# Patient Record
Sex: Male | Born: 1950 | Race: White | Hispanic: No | Smoking: Current every day smoker
Health system: Southern US, Community
[De-identification: ages and names within clinical notes are randomized; demographics above are authoritative.]

## PROBLEM LIST (undated history)

## (undated) DIAGNOSIS — I480 Paroxysmal atrial fibrillation: Secondary | ICD-10-CM

## (undated) DIAGNOSIS — M1990 Unspecified osteoarthritis, unspecified site: Secondary | ICD-10-CM

## (undated) DIAGNOSIS — J189 Pneumonia, unspecified organism: Secondary | ICD-10-CM

## (undated) DIAGNOSIS — K219 Gastro-esophageal reflux disease without esophagitis: Secondary | ICD-10-CM

## (undated) DIAGNOSIS — I639 Cerebral infarction, unspecified: Secondary | ICD-10-CM

## (undated) HISTORY — DX: Paroxysmal atrial fibrillation: I48.0

## (undated) HISTORY — PX: NASAL FRACTURE SURGERY: SHX718

## (undated) HISTORY — DX: Gastro-esophageal reflux disease without esophagitis: K21.9

## (undated) HISTORY — PX: FRACTURE SURGERY: SHX138

---

## 2007-06-04 DIAGNOSIS — F17210 Nicotine dependence, cigarettes, uncomplicated: Secondary | ICD-10-CM | POA: Diagnosis present

## 2014-04-28 DIAGNOSIS — J189 Pneumonia, unspecified organism: Secondary | ICD-10-CM

## 2014-04-28 HISTORY — DX: Pneumonia, unspecified organism: J18.9

## 2014-08-06 DIAGNOSIS — J189 Pneumonia, unspecified organism: Secondary | ICD-10-CM | POA: Diagnosis not present

## 2014-08-21 DIAGNOSIS — R062 Wheezing: Secondary | ICD-10-CM | POA: Insufficient documentation

## 2014-08-22 DIAGNOSIS — J309 Allergic rhinitis, unspecified: Secondary | ICD-10-CM | POA: Diagnosis not present

## 2014-08-22 DIAGNOSIS — J189 Pneumonia, unspecified organism: Secondary | ICD-10-CM | POA: Diagnosis not present

## 2014-08-22 DIAGNOSIS — R062 Wheezing: Secondary | ICD-10-CM | POA: Diagnosis not present

## 2014-08-22 DIAGNOSIS — I480 Paroxysmal atrial fibrillation: Secondary | ICD-10-CM | POA: Diagnosis not present

## 2014-08-31 DIAGNOSIS — E780 Pure hypercholesterolemia: Secondary | ICD-10-CM | POA: Diagnosis not present

## 2014-08-31 DIAGNOSIS — J189 Pneumonia, unspecified organism: Secondary | ICD-10-CM | POA: Diagnosis not present

## 2014-08-31 DIAGNOSIS — K219 Gastro-esophageal reflux disease without esophagitis: Secondary | ICD-10-CM | POA: Diagnosis not present

## 2014-08-31 DIAGNOSIS — R5383 Other fatigue: Secondary | ICD-10-CM | POA: Diagnosis not present

## 2014-09-26 DIAGNOSIS — J189 Pneumonia, unspecified organism: Secondary | ICD-10-CM | POA: Insufficient documentation

## 2014-09-29 DIAGNOSIS — J189 Pneumonia, unspecified organism: Secondary | ICD-10-CM | POA: Diagnosis not present

## 2014-09-29 DIAGNOSIS — R5383 Other fatigue: Secondary | ICD-10-CM | POA: Diagnosis not present

## 2015-01-08 DIAGNOSIS — M542 Cervicalgia: Secondary | ICD-10-CM | POA: Diagnosis not present

## 2015-01-08 DIAGNOSIS — I480 Paroxysmal atrial fibrillation: Secondary | ICD-10-CM | POA: Diagnosis not present

## 2015-01-08 DIAGNOSIS — K219 Gastro-esophageal reflux disease without esophagitis: Secondary | ICD-10-CM | POA: Diagnosis not present

## 2015-06-29 DIAGNOSIS — M542 Cervicalgia: Secondary | ICD-10-CM | POA: Diagnosis not present

## 2015-06-29 DIAGNOSIS — R5383 Other fatigue: Secondary | ICD-10-CM | POA: Diagnosis not present

## 2015-06-29 DIAGNOSIS — I480 Paroxysmal atrial fibrillation: Secondary | ICD-10-CM | POA: Diagnosis not present

## 2015-06-29 DIAGNOSIS — K219 Gastro-esophageal reflux disease without esophagitis: Secondary | ICD-10-CM | POA: Diagnosis not present

## 2015-07-03 DIAGNOSIS — M542 Cervicalgia: Secondary | ICD-10-CM | POA: Diagnosis not present

## 2015-07-03 DIAGNOSIS — Z720 Tobacco use: Secondary | ICD-10-CM | POA: Diagnosis not present

## 2015-07-03 DIAGNOSIS — I480 Paroxysmal atrial fibrillation: Secondary | ICD-10-CM | POA: Diagnosis not present

## 2015-07-03 DIAGNOSIS — K219 Gastro-esophageal reflux disease without esophagitis: Secondary | ICD-10-CM | POA: Diagnosis not present

## 2015-12-27 DIAGNOSIS — R5383 Other fatigue: Secondary | ICD-10-CM | POA: Insufficient documentation

## 2015-12-31 DIAGNOSIS — M25519 Pain in unspecified shoulder: Secondary | ICD-10-CM | POA: Insufficient documentation

## 2015-12-31 DIAGNOSIS — K219 Gastro-esophageal reflux disease without esophagitis: Secondary | ICD-10-CM | POA: Insufficient documentation

## 2016-01-01 DIAGNOSIS — I480 Paroxysmal atrial fibrillation: Secondary | ICD-10-CM | POA: Diagnosis not present

## 2016-01-01 DIAGNOSIS — K219 Gastro-esophageal reflux disease without esophagitis: Secondary | ICD-10-CM | POA: Diagnosis not present

## 2016-01-01 DIAGNOSIS — M25512 Pain in left shoulder: Secondary | ICD-10-CM | POA: Diagnosis not present

## 2016-01-01 DIAGNOSIS — Z720 Tobacco use: Secondary | ICD-10-CM | POA: Diagnosis not present

## 2016-01-01 DIAGNOSIS — Z6826 Body mass index (BMI) 26.0-26.9, adult: Secondary | ICD-10-CM | POA: Diagnosis not present

## 2016-07-31 DIAGNOSIS — Z720 Tobacco use: Secondary | ICD-10-CM | POA: Diagnosis not present

## 2016-07-31 DIAGNOSIS — I480 Paroxysmal atrial fibrillation: Secondary | ICD-10-CM | POA: Diagnosis not present

## 2016-07-31 DIAGNOSIS — K219 Gastro-esophageal reflux disease without esophagitis: Secondary | ICD-10-CM | POA: Diagnosis not present

## 2016-07-31 DIAGNOSIS — Z6825 Body mass index (BMI) 25.0-25.9, adult: Secondary | ICD-10-CM | POA: Diagnosis not present

## 2016-08-26 ENCOUNTER — Encounter: Payer: Self-pay | Admitting: Cardiology

## 2016-08-26 NOTE — Progress Notes (Signed)
Cardiology Office Note  Date: 08/27/2016   ID: [name], DOB [birth year], MRN [id number]  PCP: Macky Lower  Consulting Cardiologist: [name], MD   Chief Complaint  Patient presents with  . PAF    History of Present Illness: Edward Young is a 66 y.o. male referred for cardiology consultation by Ms. Skillman PA-C for the assessment of atrial fibrillation. I was not able to access any of his prior cardiology records. He has a reported history of paroxysmal atrial fibrillation. Patient was previously followed in the [facility], not seen in several years. CHADSVASC score is 1. Based on discussion with him today, it sounds like he was on sotalol for a period of time for suppression of atrial fibrillation, although did not tolerate this medication in terms of rhythm control and also what sounds like symptomatic bradycardia. He states that breakthrough events occurred about once a year but increased in frequency, and he has had much more frequent events since January. He describes a feeling of rapid heart rate proceeded by a skip, sometimes discomfort in his chest when this happens. He is not reporting any history of ischemic heart disease or cardiomyopathy, reports having a stress test perhaps for 5 years ago.  I personally reviewed the recent ECG from 07/30/2016 which showed normal sinus rhythm.  At the present time he is not taking any medications when he has palpitations other than using an aspirin.  Past Medical History:  Diagnosis Date  . GERD (gastroesophageal reflux disease)   . PAF (paroxysmal atrial fibrillation) (HCC)     Past Surgical History:  Procedure Laterality Date  . NOSE SURGERY      Current Outpatient Prescriptions  Medication Sig Dispense Refill  . aspirin 325 MG tablet Take 325 mg by mouth daily as needed.      No current facility-administered medications for this visit.    Allergies:  Patient has no known  allergies.   Social History: The patient  reports that he has been smoking Cigarettes.  He started smoking about 47 years ago. He has been smoking about 1.00 pack per day. He has never used smokeless tobacco. He reports that he does not drink alcohol or use drugs.   Family History: The patient's family history includes Alzheimer's disease in his mother; Hypertension in his father; Kidney cancer in his maternal grandfather; Parkinson's disease in his brother; Pneumonia in his paternal grandmother.   ROS:  Please see the history of present illness. Otherwise, complete review of systems is positive for none.  All other systems are reviewed and negative.   Physical Exam: VS:  BP 114/81 (BP Location: Right Arm, Cuff Size: Normal)   Pulse 69   Ht  (1.702 m)   Wt 160 lb (72.6 kg)   SpO2 97%   BMI 25.06 kg/m , BMI Body mass index is 25.06 kg/m.  Wt Readings from Last 3 Encounters:  08/27/16 160 lb (72.6 kg)  07/31/16 165 lb (74.8 kg)    General: Patient appears comfortable at rest. HEENT: Conjunctiva and lids normal, oropharynx clear with moist mucosa. Neck: Supple, no elevated JVP or carotid bruits, no thyromegaly. Lungs: Clear to auscultation, nonlabored breathing at rest. Cardiac: Regular rate and rhythm, no S3 or significant systolic murmur, no pericardial rub. Abdomen: Soft, nontender, bowel sounds present, no guarding or rebound. Extremities: No pitting edema, distal pulses 2+. Skin: Warm and dry. Musculoskeletal: No kyphosis. Neuropsychiatric: Alert and oriented x3, affect grossly appropriate.  ECG: No old  tracing available for comparison.  Assessment and Plan:  1. Reported history of paroxysmal atrial fibrillation, patient states for at least the last 15 years. I do not have any old records for review. He does report increasing frequency of palpitations and has a relatively low thromboembolic risk score. Plan at this point is to obtain a seven-day cardiac monitor to make  sure that his palpitations correspond with atrial fibrillation, also an echocardiogram to assess cardiac structure and function. Will bring him back to the office to discuss the next step.  2. Ongoing tobacco abuse. Smoking cessation discussed.  3. History of GERD, no specific medications on a standing basis at this time.  Current medicines were reviewed with the patient today.   Orders Placed This Encounter  Procedures  . Cardiac event monitor  . ECHOCARDIOGRAM COMPLETE    Disposition: Follow-up to review test results.  Signed, Jonelle Sidle, MD, [facility] 08/27/2016 10:47 AM    [facility] [address] [place], [place], [place] [address] Phone: [phone]; Fax: [phone]

## 2016-08-27 ENCOUNTER — Ambulatory Visit (INDEPENDENT_AMBULATORY_CARE_PROVIDER_SITE_OTHER): Payer: Medicare Other | Admitting: Cardiology

## 2016-08-27 ENCOUNTER — Encounter: Payer: Self-pay | Admitting: Cardiology

## 2016-08-27 VITALS — BP 114/81 | HR 69 | Ht 67.0 in | Wt 160.0 lb

## 2016-08-27 DIAGNOSIS — Z720 Tobacco use: Secondary | ICD-10-CM

## 2016-08-27 DIAGNOSIS — I480 Paroxysmal atrial fibrillation: Secondary | ICD-10-CM | POA: Diagnosis not present

## 2016-08-27 DIAGNOSIS — K219 Gastro-esophageal reflux disease without esophagitis: Secondary | ICD-10-CM | POA: Diagnosis not present

## 2016-08-27 NOTE — Patient Instructions (Signed)
Medication Instructions:  Your physician recommends that you continue on your current medications as directed. Please refer to the Current Medication list given to you today.  Labwork: NONE  Testing/Procedures: Your physician has requested that you have an echocardiogram. Echocardiography is a painless test that uses sound waves to create images of your heart. It provides your doctor with information about the size and shape of your heart and how well your heart's chambers and valves are working. This procedure takes approximately one hour. There are no restrictions for this procedure.  Your physician has recommended that you wear an event monitor FOR 7 DAYS. Event monitors are medical devices that record the heart's electrical activity. Doctors most often [place] these monitors to diagnose arrhythmias. Arrhythmias are problems with the speed or rhythm of the heartbeat. The monitor is a small, portable device. You can wear one while you do your normal daily activities. This is usually used to diagnose what is causing palpitations/syncope (passing out).   Follow-Up: Your physician recommends that you schedule a follow-up appointment in: 3-4 WEEKS WITH DR. MCDOWELL  Any Other Special Instructions Will Be Listed Below (If Applicable).  If you need a refill on your cardiac medications before your next appointment, please call your pharmacy.

## 2016-09-06 ENCOUNTER — Encounter: Payer: Self-pay | Admitting: Cardiology

## 2016-09-06 DIAGNOSIS — I480 Paroxysmal atrial fibrillation: Secondary | ICD-10-CM | POA: Diagnosis not present

## 2016-09-07 ENCOUNTER — Observation Stay (HOSPITAL_COMMUNITY)
Admission: EM | Admit: 2016-09-07 | Discharge: 2016-09-08 | Disposition: A | Discharge status: Home / Self Care | Payer: Medicare Other | Attending: Family Medicine | Admitting: Family Medicine

## 2016-09-07 ENCOUNTER — Telehealth: Payer: Self-pay | Admitting: Adult Health

## 2016-09-07 ENCOUNTER — Emergency Department (HOSPITAL_COMMUNITY): Payer: Medicare Other

## 2016-09-07 ENCOUNTER — Encounter (HOSPITAL_COMMUNITY): Payer: Self-pay

## 2016-09-07 DIAGNOSIS — I480 Paroxysmal atrial fibrillation: Principal | ICD-10-CM | POA: Insufficient documentation

## 2016-09-07 DIAGNOSIS — F17210 Nicotine dependence, cigarettes, uncomplicated: Secondary | ICD-10-CM | POA: Insufficient documentation

## 2016-09-07 DIAGNOSIS — Z9889 Other specified postprocedural states: Secondary | ICD-10-CM | POA: Insufficient documentation

## 2016-09-07 DIAGNOSIS — R002 Palpitations: Secondary | ICD-10-CM | POA: Diagnosis not present

## 2016-09-07 DIAGNOSIS — Z8249 Family history of ischemic heart disease and other diseases of the circulatory system: Secondary | ICD-10-CM | POA: Diagnosis not present

## 2016-09-07 DIAGNOSIS — Z7982 Long term (current) use of aspirin: Secondary | ICD-10-CM | POA: Insufficient documentation

## 2016-09-07 DIAGNOSIS — Z8051 Family history of malignant neoplasm of kidney: Secondary | ICD-10-CM | POA: Insufficient documentation

## 2016-09-07 DIAGNOSIS — I700 Atherosclerosis of aorta: Secondary | ICD-10-CM | POA: Insufficient documentation

## 2016-09-07 DIAGNOSIS — Z820 Family history of epilepsy and other diseases of the nervous system: Secondary | ICD-10-CM | POA: Diagnosis not present

## 2016-09-07 DIAGNOSIS — K219 Gastro-esophageal reflux disease without esophagitis: Secondary | ICD-10-CM | POA: Diagnosis not present

## 2016-09-07 DIAGNOSIS — I4891 Unspecified atrial fibrillation: Secondary | ICD-10-CM

## 2016-09-07 DIAGNOSIS — F17200 Nicotine dependence, unspecified, uncomplicated: Secondary | ICD-10-CM

## 2016-09-07 LAB — CBC WITH DIFFERENTIAL/PLATELET
BASOS PCT: 1 %
Basophils Absolute: 0 10*3/uL (ref 0.0–0.1)
EOS ABS: 0.1 10*3/uL (ref 0.0–0.7)
EOS PCT: 1 %
HCT: 52.6 % — ABNORMAL HIGH (ref 39.0–52.0)
Hemoglobin: 18.7 g/dL — ABNORMAL HIGH (ref 13.0–17.0)
Lymphocytes Relative: 38 %
Lymphs Abs: 2.9 10*3/uL (ref 0.7–4.0)
MCH: 34.5 pg — AB (ref 26.0–34.0)
MCHC: 35.6 g/dL (ref 30.0–36.0)
MCV: 97 fL (ref 78.0–100.0)
Monocytes Absolute: 0.9 10*3/uL (ref 0.1–1.0)
Monocytes Relative: 12 %
Neutro Abs: 3.7 10*3/uL (ref 1.7–7.7)
Neutrophils Relative %: 48 %
PLATELETS: 247 10*3/uL (ref 150–400)
RBC: 5.42 MIL/uL (ref 4.22–5.81)
RDW: 14.3 % (ref 11.5–15.5)
WBC: 7.6 10*3/uL (ref 4.0–10.5)

## 2016-09-07 LAB — BASIC METABOLIC PANEL
ANION GAP: 9 (ref 5–15)
BUN: 5 mg/dL — AB (ref 6–20)
CALCIUM: 9.3 mg/dL (ref 8.9–10.3)
CO2: 22 mmol/L (ref 22–32)
CREATININE: 0.93 mg/dL (ref 0.61–1.24)
Chloride: 106 mmol/L (ref 101–111)
GFR calc Af Amer: >60 mL/min (ref >60)
Glucose, Bld: 142 mg/dL — ABNORMAL HIGH (ref 65–99)
POTASSIUM: 3.5 mmol/L (ref 3.5–5.1)
SODIUM: 137 mmol/L (ref 135–145)

## 2016-09-07 LAB — MRSA PCR SCREENING: MRSA BY PCR: NEGATIVE

## 2016-09-07 MED ORDER — SODIUM CHLORIDE 0.9% FLUSH
3.0000 mL | Freq: Two times a day (BID) | INTRAVENOUS | Status: DC
  Administered 2016-09-07 – 2016-09-08 (×3): 3 mL via INTRAVENOUS

## 2016-09-07 MED ORDER — DILTIAZEM LOAD VIA INFUSION
15.0000 mg | Freq: Once | INTRAVENOUS | Status: AC
  Administered 2016-09-07: 15 mg via INTRAVENOUS
  Filled 2016-09-07: qty 15

## 2016-09-07 MED ORDER — SODIUM CHLORIDE 0.9% FLUSH
3.0000 mL | Freq: Two times a day (BID) | INTRAVENOUS | Status: DC
  Administered 2016-09-07: 3 mL via INTRAVENOUS

## 2016-09-07 MED ORDER — DILTIAZEM HCL 100 MG IV SOLR
5.0000 mg/h | INTRAVENOUS | Status: DC
  Administered 2016-09-07: 5 mg/h via INTRAVENOUS
  Administered 2016-09-07: 15 mg/h via INTRAVENOUS
  Administered 2016-09-08: 5 mg/h via INTRAVENOUS
  Filled 2016-09-07: qty 100

## 2016-09-07 MED ORDER — ACETAMINOPHEN 650 MG RE SUPP: 650.0000 mg | Freq: Four times a day (QID) | RECTAL | Status: DC | PRN

## 2016-09-07 MED ORDER — ENOXAPARIN SODIUM 40 MG/0.4ML ~~LOC~~ SOLN
40.0000 mg | SUBCUTANEOUS | Status: DC
  Administered 2016-09-07: 40 mg via SUBCUTANEOUS
  Filled 2016-09-07: qty 0.4

## 2016-09-07 MED ORDER — ASPIRIN EC 325 MG PO TBEC
325.0000 mg | DELAYED_RELEASE_TABLET | Freq: Every day | ORAL | Status: DC
  Administered 2016-09-07: 325 mg via ORAL
  Filled 2016-09-07: qty 1

## 2016-09-07 MED ORDER — SODIUM CHLORIDE 0.9 % IV SOLN
250.0000 mL | INTRAVENOUS | Status: DC | PRN
  Administered 2016-09-07: 250 mL via INTRAVENOUS

## 2016-09-07 MED ORDER — SODIUM CHLORIDE 0.9% FLUSH: 3.0000 mL | INTRAVENOUS | Status: DC | PRN

## 2016-09-07 MED ORDER — METHYLPREDNISOLONE SODIUM SUCC 125 MG IJ SOLR
INTRAMUSCULAR | Status: AC
  Filled 2016-09-07: qty 2

## 2016-09-07 MED ORDER — ACETAMINOPHEN 325 MG PO TABS: 650.0000 mg | ORAL_TABLET | Freq: Four times a day (QID) | ORAL | Status: DC | PRN

## 2016-09-07 MED ORDER — SODIUM CHLORIDE 0.9 % IV BOLUS (SEPSIS)
1000.0000 mL | Freq: Once | INTRAVENOUS | Status: AC
  Administered 2016-09-07: 1000 mL via INTRAVENOUS

## 2016-09-07 NOTE — H&P (Signed)
History and Physical  Edward Young ZOX:[id number] DOB: 01-16-1951 DOA: 09/07/2016  PCP: Roma Kayser, PA-C  Patient coming from: home  Chief Complaint: Palpitations  HPI:  66 year old man PMH paroxysmal atrial fibrillation, currently on an outpatient cardiac monitor, referred to the emergency department went this monitor revealed atrial fibrillation with rapid ventricular response.  Patient reports a history of 15 or more years with atrial fibrillation. Paroxysmal nature. Often will last up to 12 hours. Usually not an intense sensation although it is detectable.  He established with Dr. Diona Browner 5/2, and at that time a cardiac monitor was placed.  He was watching TV this morning when at about 8 AM he felt a couple of intense palpitations, and then a fluttering feeling which is typical of his episodes. He pressed the button on his outpatient cardiac monitor which triggered remote evaluation and subsequent call to the cardiology team. The cardiology team called the patient and directed him to come to the emergency department.  ED Course: Started on IV diltiazem infusion after bolus.   Review of Systems:  Negative for fever, visual changes, sore throat, rash, new muscle aches, chest pain, diaphoresis, nausea, vomiting, pain in left arm neck or jaw, shortness of breath, dysuria, bleeding, nausea, vomiting and abdominal pain   Past Medical History:  Diagnosis Date  . GERD (gastroesophageal reflux disease)   . PAF (paroxysmal atrial fibrillation) (HCC)     Past Surgical History:  Procedure Laterality Date  . NOSE SURGERY       reports that he has been smoking Cigarettes.  He started smoking about 47 years ago. He has been smoking about 1.00 pack per day. He has never used smokeless tobacco. He reports that he does not drink alcohol or use drugs.   No Known Allergies  Family History  Problem Relation Age of Onset  . Alzheimer's disease Mother   . Hypertension Father     . Parkinson's disease Brother   . Kidney cancer Maternal Grandfather   . Pneumonia Paternal Grandmother      Prior to Admission medications   Medication Sig Start Date End Date Taking? Authorizing Provider  aspirin EC 325 MG tablet Take 325 mg by mouth at bedtime.   Yes [provider]    Physical Exam: Temperature 97.6, respirations 21, pulse as high as 171, currently 132. Blood pressure 123/79. 96% on room air.    Constitutional: Appears calm, comfortable. Well appearing.  Eyes: Pupils, irises, lids appear normal.  ENT: Lips and tongue appear normal. Mildly hard of hearing.  Cardiovascular: Tachycardic irregular, no murmur, rub or gallop. No lower extremity edema.  Respiratory: Clear to auscultation bilaterally. No wheezes, rales or rhonchi. Normal respiratory effort.  Abdomen: Soft, nontender, nondistended. No masses appreciated.  Skin: No rash or induration. Nontender to palpation.  Musculoskeletal: Grossly normal tone and strength all extremities. No tenderness with palpation of all extremity. Digits and nails of the upper extremities appear normal.  Psychiatric: Grossly normal mood and affect. Speech fluent and appropriate.   Wt Readings from Last 3 Encounters:  09/07/16 72.6 kg (160 lb)  08/27/16 72.6 kg (160 lb)  07/31/16 74.8 kg (165 lb)    I have personally reviewed following labs and imaging studies  Labs:   Basic metabolic panel unremarkable except for glucose of 142 (random)  Hemoglobin 18.7, remainder CBC unremarkable.  Imaging studies:   Chest x-ray independently reviewed, no acute disease. I concur with radiologist interpretation. Also noted by radiologist: aortic atherosclerosis.  Medical tests:  EKG independently reviewed: Atrial fibrillation with rapid ventricular response, left axis deviation. Compared to previous study 07/30/2016, rhythm is no atrial fibrillation. Left axis deviation is old.  Test discussed with performing  physician:    Decision to obtain old records:     Review and summation of old records:   5/2 cardiology office visit: Initial evaluation, previously a patient had an outside system. Reported history of paroxysmal atrial fibrillation for at least 15 years, no records were available for review. Relatively low thromboembolic risk score, therefore cardiology recommended seven-day cardiac monitor.  5/13 telephone note: Outpatient cardiac monitor revealed atrial fibrillation with RVR, heart rate 150. Patient was reportedly asymptomatic. Patient was instructed by the cardiology team to come to the hospital for further treatment.  Principal Problem:   Atrial fibrillation with RVR (HCC) Active Problems:   Cigarette smoker   Nicotine dependence   Aortic atherosclerosis (HCC)   Assessment/Plan #1: Paroxysmal atrial fibrillation, CHA2DS2-VASc 1. -Treated with diltiazem bolus and infusion in the emergency department.  -Given paroxysmal nature, will pursue rate control strategy. Heart rate 105-150s during examination and quite labile. Patient appears completely asymptomatic. Continue IV diltiazem. -Continue aspirin. -Cardiology consultation in a.m. for further recommendations.  #2: Nicotine dependence -Will recommend cessation  #3: Aortic atherosclerosis -Asymptomatic. Consider statin therapy as an outpatient.  It is my clinical opinion that referral for OBSERVATION is reasonable and necessary in this patient  The aforementioned taken together are felt to place the patient at high risk for further clinical deterioration. However it is anticipated that the patient may be medically stable for discharge from the hospital within 24 to 48 hours.    DVT prophylaxis: Enoxaparin Code Status: Full Family Communication: None    Time spent: 60 minutes  Brendia Sacksaniel Licia Harl, MD  [facility] Direct contact: [phone] --Via amion app OR  --[email or web address]; password [email or web address]  7PM-7AM contact  night coverage as above  09/07/2016, 1:34 PM

## 2016-09-07 NOTE — ED Provider Notes (Signed)
[facility]-EMERGENCY DEPT Provider Note   CSN: [id number] Arrival date & time: 09/07/16  1049   By signing my name below, I, Edward Young, attest that this documentation has been prepared under the direction and in the presence of Edward Young, Edward Cower, MD. Electronically Signed: Bobbie Young, Scribe. 09/07/16. 11:12 AM. History   Chief Complaint Chief Complaint  Patient presents with  . Atrial Fibrillation    The history is provided by the patient. No language interpreter was used.  HPI Comments: Edward Young is a 66 y.o. male with hx of A-fib with RVR who presents to the Emergency Department complaining of palpitations since 8 am today. Patient began wearing a heart monitor yesterday and was called and advised to come to the ED because he is in A-fib. His heart rate was noted to be between 130 and 160. He states that he has a hx of A-fib for the past 15 years. He states that this occurs about every 4 days. His episodes can last as anywhere from 5 minutes to 30 hours. He is no currently on any medications to control his heart rates or any anticoagulants. He states that he was on medication for a fast heart rate in the past but his rate kept declining to very low levels so he was taken off of the medication. He currently lives by himself. He takes one adult ASA (325 mg) daily. He denies SOB or CP.  Past Medical History:  Diagnosis Date  . GERD (gastroesophageal reflux disease)   . PAF (paroxysmal atrial fibrillation) [facility])     Patient Active Problem List   Diagnosis Date Noted  . Atrial fibrillation with RVR (HCC) 09/07/2016  . Nicotine dependence 09/07/2016  . Aortic atherosclerosis (HCC) 09/07/2016  . Cigarette smoker 06/04/2007    Past Surgical History:  Procedure Laterality Date  . NOSE SURGERY         Home Medications    Prior to Admission medications   Medication Sig Start Date End Date Taking? Authorizing Provider  aspirin EC 325 MG tablet Take 325 mg by mouth at  bedtime.   Yes [provider]    Family History Family History  Problem Relation Age of Onset  . Alzheimer's disease Mother   . Hypertension Father   . Parkinson's disease Brother   . Kidney cancer Maternal Grandfather   . Pneumonia Paternal Grandmother     Social History Social History  Substance Use Topics  . Smoking status: Current Every Day Smoker    Packs/day: 1.00    Types: Cigarettes    Start date: 02/24/1969  . Smokeless tobacco: Never Used  . Alcohol use No     Allergies   Patient has no known allergies.   Review of Systems Review of Systems  Constitutional: Negative for fever.  Respiratory: Negative for shortness of breath.   Cardiovascular: Positive for palpitations. Negative for chest pain.  Skin: Negative for rash.  All other systems reviewed and are negative.    Physical Exam Updated Vital Signs BP (!) 130/120 (BP Location: Left Arm)   Pulse (!) 127   Temp 97.9 F (36.6 C) (Oral)   Resp 15   Ht 5\' 7"  (1.702 m)   Wt 154 lb 12.2 oz (70.2 kg)   SpO2 95%   BMI 24.24 kg/m   Physical Exam  Constitutional: He is oriented to person, place, and time. He appears well-developed and well-nourished.  HENT:  Head: Normocephalic.  Eyes: Conjunctivae and EOM are normal.  Neck: Normal range  of motion.  Cardiovascular: An irregularly irregular rhythm present. Tachycardia present.   Pulmonary/Chest: Effort normal and breath sounds normal.  Lungs nl. No tachypnea.  Abdominal: Soft. He exhibits no distension.  Musculoskeletal: Normal range of motion. He exhibits no edema.  No extremity swelling.  Neurological: He is alert and oriented to person, place, and time.  Skin: Skin is warm and dry.  Psychiatric: He has a normal mood and affect.  Nursing note and vitals reviewed.    ED Treatments / Results  DIAGNOSTIC STUDIES: Oxygen Saturation is 98% on RA, normal by my interpretation.   COORDINATION OF CARE: 10:54 AM Discussed treatment plan  with pt at bedside and pt agreed to plan. I will check his EKG and labs.  Labs (all labs ordered are listed, but only abnormal results are displayed) Labs Reviewed  CBC WITH DIFFERENTIAL/PLATELET - Abnormal; Notable for the following:       Result Value   Hemoglobin 18.7 (*)    HCT 52.6 (*)    MCH 34.5 (*)    All other components within normal limits  BASIC METABOLIC PANEL - Abnormal; Notable for the following:    Glucose, Bld 142 (*)    BUN 5 (*)    All other components within normal limits  MRSA PCR SCREENING    EKG  EKG Interpretation  Date/Time:  Sunday Sep 07 2016 10:55:39 EDT Ventricular Rate:  172 PR Interval:    QRS Duration: 89 QT Interval:  265 QTC Calculation: 454 R Axis:   -80 Text Interpretation:  Atrial fibrillation with rapid V-rate LAD, consider left anterior fascicular block No old tracing to compare Confirmed by [facility] MD, Edward CowerJASON [phone]([id number]) on 09/07/2016 11:14:01 AM       Radiology Dg Chest 2 View  Result Date: 09/07/2016 CLINICAL DATA:  Palpitations, pt was on heart monitor, today the monitor service informed him to come to the ER, tachycardia EXAM: CHEST  2 VIEW COMPARISON:  None. FINDINGS: Heart size and mediastinal contours are normal. Mild atherosclerotic changes noted at the aortic arch. Mild scarring/fibrosis at each lung apex. Probable mild scarring/ fibrosis at the right lung base. No confluent opacity to suggest a developing pneumonia. No pleural effusion or pneumothorax seen. Mild degenerative spurring within the thoracic spine. No acute or suspicious osseous finding. IMPRESSION: 1. No active cardiopulmonary disease. No evidence of pneumonia or pulmonary edema. 2. Aortic atherosclerosis. 3. Probable scarring/fibrosis at each lung apex and at the right lung base. Electronically Signed   By: Edward Young  Maynard M.D.   On: 09/07/2016 11:40    Procedures Procedures (including critical care time)  CRITICAL CARE Performed by: Marily MemosMesner, Herron Fero Total critical care  time: 35 minutes Critical care time was exclusive of separately billable procedures and treating other patients. Critical care was necessary to treat or prevent imminent or life-threatening deterioration. Critical care was time spent personally by me on the following activities: development of treatment plan with patient and/or surrogate as well as nursing, discussions with consultants, evaluation of patient's response to treatment, examination of patient, obtaining history from patient or surrogate, ordering and performing treatments and interventions, ordering and review of laboratory studies, ordering and review of radiographic studies, pulse oximetry and re-evaluation of patient's condition.   Medications Ordered in ED Medications  diltiazem (CARDIZEM) 1 mg/mL load via infusion 15 mg (15 mg Intravenous Bolus from Bag 09/07/16 1121)    And  diltiazem (CARDIZEM) 100 mg in dextrose 5 % 100 mL (1 mg/mL) infusion (15 mg/hr Intravenous Transfusing/Transfer 09/07/16 1353)  aspirin EC tablet 325 mg (not administered)  enoxaparin (LOVENOX) injection 40 mg (not administered)  sodium chloride flush (NS) 0.9 % injection 3 mL (not administered)  sodium chloride flush (NS) 0.9 % injection 3 mL (not administered)  sodium chloride flush (NS) 0.9 % injection 3 mL (not administered)  0.9 %  sodium chloride infusion (not administered)  acetaminophen (TYLENOL) tablet 650 mg (not administered)    Or  acetaminophen (TYLENOL) suppository 650 mg (not administered)  sodium chloride 0.9 % bolus 1,000 mL (0 mLs Intravenous Stopped 09/07/16 1352)     Initial Impression / Assessment and Plan / ED Course  I have reviewed the triage vital signs and the nursing notes.  Pertinent labs & imaging results that were available during my care of the patient were reviewed by me and considered in my medical decision making (see chart for details).     66 year old male with a history of paroxysmal atrial fibrillation but not  currently on any rate or rhythm control medication and also on aspirin presents to emergency department. Palpitations. He's been having to wear a monitor for the last couple weeks is had increased frequency in his palpitations however today's episode is lasted much longer than normal. He states this happened about 3-4 hours prior to arrival and has gotten any better since then. He has no other symptoms. Exam as above is relatively benign aside from his heart rate. I started the patient on Cardizem after Cardizem bolus. Discussed case with cardiology who agrees with no cardioversion secondary to the paroxysmal nature fibrillation. Does not suggest any other new anticoagulants at this time as the patient starting on aspirin however did recommend getting a cardiology evaluation in the morning for further recommendations. The patient's heart rates in the 130s to 140s on 10 mg diltiazem so increased to 15. Plan for admission to stepdown with medicine for further management and cardiology evaluation.  Final Clinical Impressions(s) / ED Diagnoses   Final diagnoses:  Atrial fibrillation with RVR (HCC)   I personally performed the services described in this documentation, which was scribed in my presence. The recorded information has been reviewed and is accurate.    Marily Memos, MD 09/07/16 1447

## 2016-09-07 NOTE — ED Notes (Signed)
Monitor shows Afib RVR 103/115.  Cardizem at 15 mg/hr via pump.

## 2016-09-07 NOTE — Telephone Encounter (Signed)
[facility] that patient pushed the button indicating that he was having palpitations. EKG revealed atrial fib with RVR HR of 150 bpm.  They called and checked on him and he was asymptomatic.   I had them review his EKG and they found his HR ranging between 130 bpm and 160 bpm, atrial fib with RVR.    I called the patient to notify him that [facility] [place] to be placed on monitor and have medications started along with anticoagulation. He verbalizes understanding and will come to ER.

## 2016-09-07 NOTE — ED Triage Notes (Signed)
Pt reports started wearing a heart monitor yesterday and was called and told to come to er because he was in a fib.  Reports hr 130-160.  Dr's office reports pt is not on any meds to control his heart rate or any anticoagulants.  Pt denies any other symptoms.

## 2016-09-07 NOTE — Plan of Care (Signed)
Problem: Education: Goal: Knowledge of [facility] Education information/materials will improve Outcome: Progressing Needs glasses from home to read information. Verbally gave information pertinent at this time

## 2016-09-07 NOTE — ED Notes (Signed)
Informed Dr Clayborne DanaMesner of difference between pulse and apical rates.  Instructed to increase Cardizem rate to 15 mg/hr via pump.

## 2016-09-08 ENCOUNTER — Encounter (HOSPITAL_COMMUNITY): Payer: Self-pay | Admitting: Adult Health

## 2016-09-08 ENCOUNTER — Observation Stay (HOSPITAL_BASED_OUTPATIENT_CLINIC_OR_DEPARTMENT_OTHER): Payer: Medicare Other

## 2016-09-08 DIAGNOSIS — F17210 Nicotine dependence, cigarettes, uncomplicated: Secondary | ICD-10-CM | POA: Diagnosis not present

## 2016-09-08 DIAGNOSIS — I4891 Unspecified atrial fibrillation: Secondary | ICD-10-CM

## 2016-09-08 DIAGNOSIS — I700 Atherosclerosis of aorta: Secondary | ICD-10-CM | POA: Diagnosis not present

## 2016-09-08 LAB — ECHOCARDIOGRAM COMPLETE
CHL CUP DOP CALC LVOT VTI: 14.6 cm
E decel time: 218 msec
FS: 35 % (ref 28–44)
Height: 67 in
IVS/LV PW RATIO, ED: 0.91
LA ID, A-P, ES: 32 mm
LA diam index: 1.74 cm/m2
LA vol index: 19.9 mL/m2
LAVOL: 36.6 mL
LAVOLA4C: 36.8 mL
LDCA: 3.46 cm2
LEFT ATRIUM END SYS DIAM: 32 mm
LV dias vol: 49 mL — AB (ref 62–150)
LVDIAVOLIN: 27 mL/m2
LVOT diameter: 21 mm
LVOT peak grad rest: 3 mmHg
LVOT peak vel: 81.3 cm/s
LVOTSV: 51 mL
MV Dec: 218
MVPG: 4 mmHg
MVPKEVEL: 104 m/s
PW: 10.4 mm — AB (ref 0.6–1.1)
RV TAPSE: 19.1 mm
Weight: 2504.43 oz

## 2016-09-08 LAB — TSH: TSH: 4.788 u[IU]/mL — AB (ref 0.350–4.500)

## 2016-09-08 MED ORDER — DILTIAZEM HCL 60 MG PO TABS: 60.0000 mg | ORAL_TABLET | Freq: Two times a day (BID) | ORAL | 0 refills | Status: DC

## 2016-09-08 MED ORDER — DILTIAZEM HCL 30 MG PO TABS
30.0000 mg | ORAL_TABLET | Freq: Four times a day (QID) | ORAL | Status: DC
  Administered 2016-09-08 (×2): 30 mg via ORAL
  Filled 2016-09-08 (×2): qty 1

## 2016-09-08 NOTE — Progress Notes (Signed)
  PROGRESS NOTE  Edward Young AVW:[id number] DOB: [birth year] DOA: 09/07/2016 PCP: Roma KayserSkillman, Katie, PA-C  Brief Narrative: 66 year old man PMH paroxysmal atrial fibrillation, currently on an outpatient cardiac monitor, referred to the emergency department went this monitor revealed atrial fibrillation with rapid ventricular response.  Assessment/Plan #1: Paroxysmal atrial fibrillation, CHA2DS2-VASc 1. -Remains in atrial fibrillation but rate is not well controlled on diltiazem infusion 15 mg/h. -Check TSH, echocardiogram (not on file) -Cardiology consultation pending, anticipate transition to oral therapy today  #2: Nicotine dependence. -Recommend cessation  #3: Aortic atherosclerosis -Asymptomatic. Follow-up as an outpatient. Could consider statin therapy.   Patient overall appears improved in his heart rate is not controlled on diltiazem infusion. Will defer choice of oral agent to cardiology. Anticipate discharge later today.  DVT prophylaxis: enoxaparin Code Status: full Family Communication: none Disposition Plan: home    Brendia Sacksaniel Joelee Snoke, MD  [facility] Direct contact: [phone] --Via amion app OR  --[email or web address]; password [email or web address]  7PM-7AM contact night coverage as above 09/08/2016, 7:26 AM  LOS: 0 days   Consultants:  Cardiology   Procedures:    Antimicrobials:    Interval history/Subjective: Feels well. No chest pain or shortness of breath. Feels fluttering.  Objective: Vitals: Afebrile, 98.0, respirations 14, heart rate 60, blood pressure 108/75, SPO2 94%. Weight 71 kg. I/O +434.  Exam:     Constitutional: Appears calm, comfortable lying in bed.  Eyes: Pupils, irises, lids appear normal.  ENT: Mildly hard of hearing. External earsare normal.  Cardiovascular: Irregular, normal rate, no murmur, rub or gallop. No lower extremity edema. Telemetry atrial fibrillation. Bradycardia seen earlier.  Respiratory: Clear to auscultation  bilaterally. No wheezes, rales or rhonchi. Normal respiratory effort.  Abdomen soft, nontender, nondistended  Psychiatric: Grossly normal mood and affect. Speech fluent and appropriate.   I have personally reviewed the following:   Labs:    Imaging studies:    Medical tests:    Test discussed with performing physician:    Decision to obtain old records:    Review and summation of old records:    Scheduled Meds: . aspirin EC  325 mg Oral QHS  . enoxaparin (LOVENOX) injection  40 mg Subcutaneous Q24H  . sodium chloride flush  3 mL Intravenous Q12H  . sodium chloride flush  3 mL Intravenous Q12H   Continuous Infusions: . sodium chloride 250 mL (09/07/16 1736)  . diltiazem (CARDIZEM) infusion 5 mg/hr (09/08/16 0344)    Principal Problem:   Atrial fibrillation with RVR (HCC) Active Problems:   Cigarette smoker   Nicotine dependence   Aortic atherosclerosis (HCC)   LOS: 0 days

## 2016-09-08 NOTE — Progress Notes (Signed)
*  PRELIMINARY RESULTS* Echocardiogram 2D Echocardiogram has been performed.  Stacey DrainWhite, Miller Edgington J 09/08/2016, 11:44 AM

## 2016-09-08 NOTE — Discharge Summary (Signed)
Physician Discharge Summary  Maricus Tanzi ZOX:[id number] DOB: [birth year] DOA: 09/07/2016  PCP: Roma Kayser, PA-C  Admit date: 09/07/2016 Discharge date: 09/09/2016  Recommendations for Outpatient Follow-up:  1. Modest TSH elevation of unclear significance. Consider repeat testing as an outpatient.  Follow-up Information    Roma Kayser, PA-C Follow up.   Specialty:  Physician Assistant Why:  as needed Contact information: [address] [place] [address]        Jonelle Sidle, MD Follow up on 09/18/2016.   Specialty:  Cardiology Why:  3pm Contact information: [address] [place] [address] [phone]           Follow-up Information    Roma Kayser, PA-C Follow up.   Specialty:  Physician Assistant Why:  as needed Contact information: [address] [place] [address]        Jonelle Sidle, MD Follow up on 09/18/2016.   Specialty:  Cardiology Why:  3pm Contact information: [address] [place] [address] [phone]            Discharge Diagnoses:  1. Paroxysmal atrial fibrillation 2. Nicotine dependence 3. Aortic atherosclerosis  Discharge Condition: Improved Disposition: Home  Diet recommendation: Regular  Filed Weights   09/07/16 1057 09/07/16 1426 09/08/16 0530  Weight: 72.6 kg (160 lb) 70.2 kg (154 lb 12.2 oz) 71 kg (156 lb 8.4 oz)    History of present illness:  66 year old man PMH paroxysmal atrial fibrillation, currently on an outpatient cardiac monitor, referred to the emergency department went this monitor revealed atrial fibrillation with rapid ventricular response.  Hospital Course:  Patient was treated with IV diltiazem infusion with gradual control of heart rate, and successfully transitioned to oral therapy. Seen by cardiology who recommended short acting diltiazem twice a day and consideration of antiarrhythmic therapy and conversion to sinus rhythm as an outpatient. Hospitalization was uncomplicated.  He will follow-up with cardiology as an outpatient.  Consultants:  Cardiology   Procedures:  2-d echocardiogram Study Conclusions  - Left ventricle: The cavity size was normal. Wall thickness was   normal. Systolic function was normal. The estimated ejection   fraction was in the range of 60% to 65%. Wall motion was normal;   there were no regional wall motion abnormalities. The study was   not technically sufficient to allow evaluation of LV diastolic   dysfunction due to atrial fibrillation. - Mitral valve: Mildly calcified annulus.   Today's assessment: Feels well. No chest pain or shortness of breath. Feels fluttering.  Objective: Vitals: Afebrile, 98.0, respirations 14, heart rate 60, blood pressure 108/75, SPO2 94%. Weight 71 kg. I/O +434.  Exam:     Constitutional: Appears calm, comfortable lying in bed.  Eyes: Pupils, irises, lids appear normal.  ENT: Mildly hard of hearing. External earsare normal.  Cardiovascular: Irregular, normal rate, no murmur, rub or gallop. No lower extremity edema. Telemetry atrial fibrillation. Bradycardia seen earlier.  Respiratory: Clear to auscultation bilaterally. No wheezes, rales or rhonchi. Normal respiratory effort.  Abdomen soft, nontender, nondistended  Psychiatric: Grossly normal mood and affect. Speech fluent and appropriate.  Discharge Instructions  Discharge Instructions    Activity as tolerated - No restrictions    Complete by:  As directed    Diet - low sodium heart healthy    Complete by:  As directed    Discharge instructions    Complete by:  As directed    Call your physician or seek immediate medical attention for palpitations,  fatigue, falls, dizziness, passing out, lightheadedness, visual changes or worsening of condition.     Allergies as of 09/08/2016   No Known Allergies     Medication List    TAKE these medications   aspirin EC 325 MG tablet Take 325 mg by mouth at bedtime.   diltiazem  60 MG tablet Commonly known as:  CARDIZEM Take 1 tablet (60 mg total) by mouth 2 (two) times daily.      No Known Allergies  The results of significant diagnostics from this hospitalization (including imaging, microbiology, ancillary and laboratory) are listed below for reference.    Significant Diagnostic Studies: Dg Chest 2 View  Result Date: 09/07/2016 CLINICAL DATA:  Palpitations, pt was on heart monitor, today the monitor service informed him to come to the ER, tachycardia EXAM: CHEST  2 VIEW COMPARISON:  None. FINDINGS: Heart size and mediastinal contours are normal. Mild atherosclerotic changes noted at the aortic arch. Mild scarring/fibrosis at each lung apex. Probable mild scarring/ fibrosis at the right lung base. No confluent opacity to suggest a developing pneumonia. No pleural effusion or pneumothorax seen. Mild degenerative spurring within the thoracic spine. No acute or suspicious osseous finding. IMPRESSION: 1. No active cardiopulmonary disease. No evidence of pneumonia or pulmonary edema. 2. Aortic atherosclerosis. 3. Probable scarring/fibrosis at each lung apex and at the right lung base. Electronically Signed   By: Bary RichardStan  Maynard M.D.   On: 09/07/2016 11:40    Microbiology: Recent Results (from the past 240 hour(s))  MRSA PCR Screening     Status: None   Collection Time: 09/07/16  2:18 PM  Result Value Ref Range Status   MRSA by PCR NEGATIVE NEGATIVE Final    Comment:        The GeneXpert MRSA Assay (FDA approved for NASAL specimens only), is one component of a comprehensive MRSA colonization surveillance program. It is not intended to diagnose MRSA infection nor to guide or monitor treatment for MRSA infections.      Labs: Basic Metabolic Panel:  Recent Labs Lab 09/07/16 1100  NA 137  K 3.5  CL 106  CO2 22  GLUCOSE 142*  BUN 5*  CREATININE 0.93  CALCIUM 9.3   CBC:  Recent Labs Lab 09/07/16 1100  WBC 7.6  NEUTROABS 3.7  HGB 18.7*  HCT 52.6*    MCV 97.0  PLT 247     Principal Problem:   Atrial fibrillation with RVR (HCC) Active Problems:   Cigarette smoker   Nicotine dependence   Aortic atherosclerosis (HCC)   Time coordinating discharge: 35 minutes  Signed:  Brendia Sacksaniel Flynt Breeze, MD [facility] 09/09/2016, 2:10 PM

## 2016-09-08 NOTE — Care Management Obs Status (Signed)
MEDICARE OBSERVATION STATUS NOTIFICATION   Patient Details  Name: Edward Young MRN: [id number] Date of Birth: June 22, 1950   Medicare Observation Status Notification Given:  Yes    Daeton Kluth, Chrystine OilerSharley Diane, RN 09/08/2016, 1:23 PM

## 2016-09-08 NOTE — Consult Note (Addendum)
CARDIOLOGY CONSULT NOTE   Patient ID: Edward Young MRN: [id number] DOB/AGE: 1950/09/14 66 y.o.  Admit Date: 09/07/2016 Referring Physician: Irene Limbo, MD Primary Physician: Roma Kayser, PA-C Consulting Cardiologist: Prentice Docker MD Primary Cardiologist: Diona Browner, MD Reason for Consultation: Afib RVR  Clinical Summary Edward Young is a 66 y.o. male who is being seen today for the evaluation of atrial fib with RVR at the request of Dr. Irene Limbo, [facility],  The patient was sent to ER after cardiology received a transmission report from cardiac monitor reporting Afib with RVR, HR 150-160 bpm. , This was reported to Joni Reining DNP, on call over the weekend, who called the patient and advised that he come to [facility]  for admission treatment.   He was seen by Dr. Diona Browner on 08/27/2016 to evaluate PAF, which has had a history of. He had been on sotalol in the past but did not tolerate this, due to symptomatic bradycardia.   On arrival to ER BP 141/101, HR 166, O2 Sat 100%, afebrile. Pertinent labs: Hgb 18.7/Hct 52.6.  Glucose 142, BUN 5, EKG revealed atrial fib with RVR, HR 172 bpm, with LAFB. He was started on IV diltiazem after 15 mg bolus, and remains on diltiazem gtt. He has had periods of bradycardia, with some pauses noted overnight.  He remains on diltiazem at 5 mg hour. CHADS VASC score of 1, currently. He denies chest pain, dyspnea, or dizziness.    No Known Allergies  Medications Scheduled Medications: . aspirin EC  325 mg Oral QHS  . enoxaparin (LOVENOX) injection  40 mg Subcutaneous Q24H  . sodium chloride flush  3 mL Intravenous Q12H  . sodium chloride flush  3 mL Intravenous Q12H     Infusions: . sodium chloride 250 mL (09/07/16 1736)  . diltiazem (CARDIZEM) infusion 5 mg/hr (09/08/16 0344)     PRN Medications:  sodium chloride, acetaminophen **OR** acetaminophen, sodium chloride flush   Past Medical History:  Diagnosis Date    . GERD (gastroesophageal reflux disease)   . PAF (paroxysmal atrial fibrillation) (HCC)     Past Surgical History:  Procedure Laterality Date  . NOSE SURGERY      Family History  Problem Relation Age of Onset  . Alzheimer's disease Mother   . Hypertension Father   . Parkinson's disease Brother   . Kidney cancer Maternal Grandfather   . Pneumonia Paternal Grandmother      Social History [name] reports that he has been smoking Cigarettes.  He started smoking about 47 years ago. He has been smoking about 1.00 pack per day. He has never used smokeless tobacco. [name] reports that he does not drink alcohol.  Review of Systems Complete review of systems are found to be negative unless outlined in H&P above.  Physical Examination Blood pressure 99/65, pulse 73, temperature 97.7 F (36.5 C), temperature source Oral, resp. rate (!) 24, height 5\' 7"  (1.702 m), weight 156 lb 8.4 oz (71 kg), SpO2 93 %.  Intake/Output Summary (Last 24 hours) at 09/08/16 0813 Last data filed at 09/08/16 0700  Gross per 24 hour  Intake             1509 ml  Output             1725 ml  Net             -216 ml    Telemetry: Atrial fib/flutter, with rates in the 70's with evidence of pauses, over night.  GEN: No acute distress  HEENT: Conjunctiva and lids normal, oropharynx clear with moist mucosa. Neck: Supple, no elevated JVP or carotid bruits, no thyromegaly. Lungs: Clear to auscultation, nonlabored breathing at rest. Cardiac: Iregular rate and rhythm, no S3 or significant systolic murmur, no pericardial rub. Abdomen: Soft, nontender, no hepatomegaly, bowel sounds present, no guarding or rebound. Extremities: No pitting edema, distal pulses 2+. Skin: Warm and dry. Musculoskeletal: No kyphosis. Neuropsychiatric: Alert and oriented x3, affect grossly appropriate.  Prior Cardiac Testing/Procedures None Lab Results  Basic Metabolic Panel:  Recent Labs Lab 09/07/16 1100  NA 137  K  3.5  CL 106  CO2 22  GLUCOSE 142*  BUN 5*  CREATININE 0.93  CALCIUM 9.3    CBC:  Recent Labs Lab 09/07/16 1100  WBC 7.6  NEUTROABS 3.7  HGB 18.7*  HCT 52.6*  MCV 97.0  PLT 247     Radiology: Dg Chest 2 View  Result Date: 09/07/2016 CLINICAL DATA:  Palpitations, pt was on heart monitor, today the monitor service informed him to come to the ER, tachycardia EXAM: CHEST  2 VIEW COMPARISON:  None. FINDINGS: Heart size and mediastinal contours are normal. Mild atherosclerotic changes noted at the aortic arch. Mild scarring/fibrosis at each lung apex. Probable mild scarring/ fibrosis at the right lung base. No confluent opacity to suggest a developing pneumonia. No pleural effusion or pneumothorax seen. Mild degenerative spurring within the thoracic spine. No acute or suspicious osseous finding. IMPRESSION: 1. No active cardiopulmonary disease. No evidence of pneumonia or pulmonary edema. 2. Aortic atherosclerosis. 3. Probable scarring/fibrosis at each lung apex and at the right lung base. Electronically Signed   By: Bary Richard M.D.   On: 09/07/2016 11:40     ECG: Atrial fib with RVR, rate of 177 bpm.   Impression and Recommendations  1. Atrial fib with RVR: Continues on diltiazem gtt at 5 mg, some bradycardia with pauses overnight. Will transition to po diltiazem 30 mg Q 6 hours, with parameters for HR. CHADS VASC score of 1 at this time. Will have echocardiogram completed for LV fx. Continue ASA 81 mg daily. Consider TEE DCCV, but will defer to Dr. Purvis Sheffield on this.   2. Ongoing tobacco abuse: Smoking cessation is recommended.    Signed: Bettey Mare. Liborio Nixon, ANP, AACC  09/08/2016, 8:13 AM Co-Sign MD  The patient was seen and examined, and I agree with the history, physical exam, assessment and plan as documented above, with modifications as noted below. I have also personally reviewed all relevant documentation, old records, labs, and both radiographic and  cardiovascular studies. I have also independently interpreted old and new ECG's.  66 yr old male followed by cardiology in 2009 at the [facility] and then recently established with Dr. Diona Browner on 08/27/16 for paroxysmal atrial fibrillation. CHADSVASC score of 1 so was not initiated on anticoagulation. He was given an event monitor.  He was contacted by K. Lawrence NP and advised to come to the hospital as event monitoring demonstrated rapid atrial fibrillation, HR 130-160 bpm.  He was started on an IV diltiazem infusion by Dr. Irene Limbo.  He has since been transitioned to oral diltiazem 30 mg q 6 hrs. He currently denies palpitations. He got a dose about an hour ago with HR in 80-90 bpm range.  He denies chest pain, dizziness, and shortness of breath as well.  An echocardiogram was just performed which I have yet to interpret.  He has had problems with symptomatic bradycardia in the  past with sotalol.   He has an appt with Dr. Diona BrownerMcDowell this Wednesday.  He may be able to be discharged on short-acting diltiazem 60 mg bid with further antiarrhythmic management deferred to Dr. Diona BrownerMcDowell, so long as he does not have problems with bradycardia and/or pauses for the remainder of today.   Prentice DockerSuresh Koneswaran, MD, [facility] Center[facility]  09/08/2016 11:50 AM  ADDENDUM: Left ventricular systolic function is normal. Left atrial volume is normal as well, and there is no significant valvular pathology, specifically no significant mitral regurgitation. For these reasons, maintaining sinus rhythm with antiarrhythmic therapy may be more feasible.

## 2016-09-08 NOTE — Plan of Care (Signed)
Problem: Cardiac: Goal: Ability to achieve and maintain adequate cardiopulmonary perfusion will improve Outcome: Progressing Patient on PO cardizem and off cardizem drip.

## 2016-09-10 ENCOUNTER — Other Ambulatory Visit: Payer: Medicare Other

## 2016-09-18 ENCOUNTER — Encounter: Payer: Self-pay | Admitting: Cardiology

## 2016-09-18 ENCOUNTER — Ambulatory Visit (INDEPENDENT_AMBULATORY_CARE_PROVIDER_SITE_OTHER): Payer: Medicare Other | Admitting: Cardiology

## 2016-09-18 ENCOUNTER — Ambulatory Visit: Payer: Medicare Other | Admitting: Cardiology

## 2016-09-18 VITALS — BP 126/80 | HR 68 | Ht 67.0 in | Wt 159.4 lb

## 2016-09-18 DIAGNOSIS — Z720 Tobacco use: Secondary | ICD-10-CM | POA: Diagnosis not present

## 2016-09-18 DIAGNOSIS — I480 Paroxysmal atrial fibrillation: Secondary | ICD-10-CM | POA: Diagnosis not present

## 2016-09-18 MED ORDER — DILTIAZEM HCL ER COATED BEADS 120 MG PO CP24: 120.0000 mg | ORAL_CAPSULE | Freq: Every day | ORAL | 3 refills | Status: DC

## 2016-09-18 NOTE — Patient Instructions (Signed)
Medication Instructions:  Your physician has recommended you make the following change in your medication: BEGIN TAKING DILTIAZEM CD 120 MG DAILY CONTINUE ALL OTHER MEDICATIONS AS PRESCRIBED  Labwork: NONE  Testing/Procedures: NONE  Follow-Up: Your physician recommends that you schedule a follow-up appointment in: 3 MONTHS WITH DR. MCDOWELL  Any Other Special Instructions Will Be Listed Below (If Applicable). You have been referred to see Dr. Johney FrameAllred   If you need a refill on your cardiac medications before your next appointment, please call your pharmacy.

## 2016-09-18 NOTE — Progress Notes (Signed)
Cardiology Office Note  Date: 09/18/2016   ID: Edward Young, DOB 1950/09/29, MRN [id number]  PCP: Tawni Carnes, PA-C  Primary Cardiologist: Rozann Lesches, MD   Chief Complaint  Patient presents with  . PAF    History of Present Illness: Edward Young is a 66 y.o. male that I met recently in early May with reported history of PAF, there were limited records regarding prior cardiac workup over the last 15 years by other groups. Cardiac monitor was placed, ultimately showing an episode of rapid atrial fibrillation, and the patient was hospitalized briefly at [facility]. I reviewed the chart, he was seen in consultation by Dr. Bronson Ing. He was placed on a short acting diltiazem and discharged home.  ECG from May 13 at presentation showed atrial fibrillation with rapid heart rate in the 170s. Echocardiogram was also obtained on May 14 showing LVEF 60-65% with indeterminate diastolic function, normal left atrial chamber size, and no major valvular abnormalities.  CHADSVASC score is 1. He is on aspirin at this time.  He is in sinus rhythm today, although states that he is fairly certain that he has been out of rhythm a few times since hospital discharge. Today we talked about the natural history of atrial fibrillation, various treatment strategies from antiarrhythmic therapy to ablation. It sounds like he did not tolerate sotalol in the past due to symptomatic bradycardia. I recommended that he have evaluation by our EP staff to discuss the options and guide his treatment.  Past Medical History:  Diagnosis Date  . GERD (gastroesophageal reflux disease)   . PAF (paroxysmal atrial fibrillation) ([place])     Past Surgical History:  Procedure Laterality Date  . NOSE SURGERY      Current Outpatient Prescriptions  Medication Sig Dispense Refill  . aspirin EC 325 MG tablet Take 325 mg by mouth at bedtime.    . calcium carbonate (TUMS - DOSED IN MG ELEMENTAL CALCIUM) 500  MG chewable tablet Chew 1 tablet by mouth as needed for indigestion or heartburn.    . diltiazem (CARDIZEM CD) 120 MG 24 hr capsule Take 1 capsule (120 mg total) by mouth daily. 90 capsule 3   No current facility-administered medications for this visit.    Allergies:  Patient has no known allergies.   Social History: The patient  reports that he has been smoking Cigarettes.  He started smoking about 47 years ago. He has been smoking about 1.00 pack per day. He has never used smokeless tobacco. He reports that he does not drink alcohol or use drugs.   ROS:  Please see the history of present illness. Otherwise, complete review of systems is positive for none.  All other systems are reviewed and negative.   Physical Exam: VS:  BP 126/80   Pulse 68   Ht 5' 7"  (1.702 m)   Wt 159 lb 6.4 oz (72.3 kg)   SpO2 97%   BMI 24.97 kg/m , BMI Body mass index is 24.97 kg/m.  Wt Readings from Last 3 Encounters:  09/18/16 159 lb 6.4 oz (72.3 kg)  09/08/16 156 lb 8.4 oz (71 kg)  08/27/16 160 lb (72.6 kg)    General: Patient appears comfortable at rest. HEENT: Conjunctiva and lids normal, oropharynx clear with moist mucosa. Neck: Supple, no elevated JVP or carotid bruits, no thyromegaly. Lungs: Clear to auscultation, nonlabored breathing at rest. Cardiac: Regular rate and rhythm, no S3 or significant systolic murmur, no pericardial rub. Abdomen: Soft, nontender, bowel sounds present,  no guarding or rebound. Extremities: No pitting edema, distal pulses 2+. Skin: Warm and dry. Musculoskeletal: No kyphosis. Neuropsychiatric: Alert and oriented x3, affect grossly appropriate.  ECG: I personally reviewed the tracing from 09/07/2016 which showed rapid atrial fibrillation at 172 bpm with leftward axis.  Recent Labwork: 09/07/2016: BUN 5; Creatinine, Ser 0.93; Hemoglobin 18.7; Platelets 247; Potassium 3.5; Sodium 137; TSH 4.788   Other Studies Reviewed Today:  Echocardiogram 09/08/2016: Study  Conclusions  - Left ventricle: The cavity size was normal. Wall thickness was   normal. Systolic function was normal. The estimated ejection   fraction was in the range of 60% to 65%. Wall motion was normal;   there were no regional wall motion abnormalities. The study was   not technically sufficient to allow evaluation of LV diastolic   dysfunction due to atrial fibrillation. - Mitral valve: Mildly calcified annulus.  Assessment and Plan:  1. Long-standing history of symptomatic paroxysmal atrial fibrillation, CHADSVASC score is 1. At this time he is on aspirin daily. He is having increasing breakthrough episodes, was recently placed on short acting Cardizem during hospital stay at [facility], we will switch to Cardizem CD 120 mg daily. He states that he still has episodes of breakthrough arrhythmia but does not feel quite as bad on rate control medication. As noted above, he did not tolerate previous treatment with sotalol due to symptomatic bradycardia - has seen different cardiology groups over the years. I am planning to refer him to EP to discuss treatment strategies including antiarrhythmic therapy versus ablation.   2. Ongoing tobacco abuse. Smoking cessation discussed.  Current medicines were reviewed with the patient today.   Orders Placed This Encounter  Procedures  . Ambulatory referral to Cardiac Electrophysiology    Disposition: EP consultation then follow-up in the next 3 months.  Signed, Satira Sark, MD, [facility] 09/18/2016 3:20 PM    [place] [facility] [place], [place], [place] [address] Phone: [phone]; Fax: [phone]

## 2016-10-06 ENCOUNTER — Encounter: Payer: Self-pay | Admitting: Internal Medicine

## 2016-10-22 ENCOUNTER — Ambulatory Visit (INDEPENDENT_AMBULATORY_CARE_PROVIDER_SITE_OTHER): Payer: Medicare Other | Admitting: Internal Medicine

## 2016-10-22 ENCOUNTER — Encounter: Payer: Self-pay | Admitting: Internal Medicine

## 2016-10-22 VITALS — BP 128/80 | HR 83 | Ht 67.0 in | Wt 156.2 lb

## 2016-10-22 DIAGNOSIS — Z720 Tobacco use: Secondary | ICD-10-CM

## 2016-10-22 DIAGNOSIS — I480 Paroxysmal atrial fibrillation: Secondary | ICD-10-CM | POA: Diagnosis not present

## 2016-10-22 MED ORDER — RIVAROXABAN 20 MG PO TABS: 20.0000 mg | ORAL_TABLET | Freq: Every day | ORAL | 11 refills | Status: DC

## 2016-10-22 NOTE — Addendum Note (Signed)
Addended by: Dennis BastLANIER, KELLY F on: 10/22/2016 04:16 PM   Modules accepted: Orders, SmartSet

## 2016-10-22 NOTE — Patient Instructions (Signed)
Medication Instructions:  Your physician has recommended you make the following change in your medication:  1) Stop Aspirin 2) Start Xarelto 20 mg daily   Labwork: Your physician recommends that you return for lab work on 11/03/16 ---you do not have to fast for this lab   Testing/Procedures: Your physician has requested that you have cardiac CT. Cardiac computed tomography (CT) is a painless test that uses an x-ray machine to take clear, detailed pictures of your heart. For further information please visit [email or web address]. Please follow instruction sheet as given.---office will call to schedule once approved with insurance   Your physician has recommended that you have an ablation. Catheter ablation is a medical procedure used to treat some cardiac arrhythmias (irregular heartbeats). During catheter ablation, a long, thin, flexible tube is put into a blood vessel in your groin (upper thigh), or neck. This tube is called an ablation catheter. It is then guided to your heart through the blood vessel. Radio frequency waves destroy small areas of heart tissue where abnormal heartbeats may cause an arrhythmia to start. Please see the instruction sheet given to you today.---11/20/16  [facility] at 5:30[facility] Do not eat or drink after midnight the night prior to the procedure Do not take any medications the morning of the test Plan for one night stay Will need someone to drive you home at discharge      Follow-Up: Your physician recommends that you schedule a follow-up appointment in: 4 weeks from 11/20/16 with Rudi Cocoonna Carroll, NP and 3 months from 11/20/16 with Dr Johney FrameAllred   Any Other Special Instructions Will Be Listed Below (If Applicable).     If you need a refill on your cardiac medications before your next appointment, please call your pharmacy.

## 2016-10-22 NOTE — Progress Notes (Signed)
Electrophysiology Office Note   Date:  10/22/2016   ID:  Edward BaileyJohn Carroll Ferrara, DOB [birth year], MRN [id number]  PCP:  Roma KayserSkillman, Katie, PA-C  Cardiologist:  Dr Diona BrownerMcDowell Primary Electrophysiologist: Hillis RangeJames Sedric Guia, MD    Chief Complaint  Patient presents with  . Atrial Fibrillation     History of Present Illness: Edward Young is a 66 y.o. male who presents today for electrophysiology evaluation.   The patient is referred by Dr Diona BrownerMcDowell for EP consultation regarding his afib.  He reports having afib for more than 15 years.  He previously was treated with sotalol without success.  He states that he did not tolerate this medicine.  Records from 2008 from [place] suggest that he had syncope due to bradycardia on the medicine, though he does not recall this. Over the past 6 months, he has had increasing frequency and duration of atrial fibrillation.  Episodes now occur every 2-3 days, lasting up to 12 hours.  He reports symptoms of tachy palpitations, fatigue and decreased exercise tolerance.  He finds this very debilitating.  He was recently hospitalized for Afib with RVR.   Today, he denies symptoms of chest pain, shortness of breath, orthopnea, PND, lower extremity edema, claudication, dizziness, presyncope, syncope, bleeding, or neurologic sequela. The patient is tolerating medications without difficulties and is otherwise without complaint today.    Past Medical History:  Diagnosis Date  . GERD (gastroesophageal reflux disease)   . PAF (paroxysmal atrial fibrillation) (HCC)    Past Surgical History:  Procedure Laterality Date  . NOSE SURGERY       Current Outpatient Prescriptions  Medication Sig Dispense Refill  . aspirin EC 325 MG tablet Take 325 mg by mouth at bedtime.    . calcium carbonate (TUMS - DOSED IN MG ELEMENTAL CALCIUM) 500 MG chewable tablet Chew 1 tablet by mouth as needed for indigestion or heartburn.    . diltiazem (CARDIZEM CD) 120 MG 24 hr capsule Take  1 capsule (120 mg total) by mouth daily. 90 capsule 3   No current facility-administered medications for this visit.     Allergies:   Patient has no known allergies.   Social History:  The patient  reports that he has been smoking Cigarettes.  He started smoking about 47 years ago. He has been smoking about 1.00 pack per day. He has never used smokeless tobacco. He reports that he does not drink alcohol or use drugs.   Family History:  The patient's  family history includes Alzheimer's disease in his mother; Hypertension in his father; Kidney cancer in his maternal grandfather; Parkinson's disease in his brother; Pneumonia in his paternal grandmother.    ROS:  Please see the history of present illness.   All other systems are personally reviewed and negative.    PHYSICAL EXAM: VS:  BP 128/80   Pulse 83   Ht 5\' 7"  (1.702 m)   Wt 156 lb 3.2 oz (70.9 kg)   BMI 24.46 kg/m  , BMI Body mass index is 24.46 kg/m. GEN: Well nourished, well developed, in no acute distress  HEENT: normal  Neck: no JVD, carotid bruits, or masses Cardiac: RRR; no murmurs, rubs, or gallops,no edema  Respiratory:  clear to auscultation bilaterally, normal work of breathing GI: soft, nontender, nondistended, + BS MS: no deformity or atrophy  Skin: warm and dry  Neuro:  Strength and sensation are intact Psych: euthymic mood, full affect   Recent Labs: 09/07/2016: BUN 5; Creatinine, Ser 0.93; Hemoglobin 18.7; Platelets  247; Potassium 3.5; Sodium 137; TSH 4.788  personally reviewed   Lipid Panel  No results found for: CHOL, TRIG, HDL, CHOLHDL, VLDL, LDLCALC, LDLDIRECT personally reviewed   Wt Readings from Last 3 Encounters:  10/22/16 156 lb 3.2 oz (70.9 kg)  09/18/16 159 lb 6.4 oz (72.3 kg)  09/08/16 156 lb 8.4 oz (71 kg)      Other studies personally reviewed: Additional studies/ records that were reviewed today include: echo in epic, recent hospital records, Dr McDowell's records  Review of the  above records today demonstrates: as above   ASSESSMENT AND PLAN:  1.  Paroxysmal atrial fibrillation The patient has symptomatic atrial fibrillation.  He has failed medical therapy with sotalol.  Further therapy has been limited by bradycardia.  chads2vasc score is 1.  He has therefore not previously been on anticoagulation.  Therapeutic strategies for afib including medicine and ablation were discussed in detail with the patient today. Risk, benefits, and alternatives to EP study and radiofrequency ablation for afib were also discussed in detail today. These risks include but are not limited to stroke, bleeding, vascular damage, tamponade, perforation, damage to the esophagus, lungs, and other structures, pulmonary vein stenosis, worsening renal function, and death. The patient understands these risk and wishes to proceed.  We will stop ASA and start xarelto 20mg  daily today.  We will therefore proceed with catheter ablation once the patient has been adequately anticoagulated.  Cardiac CT prior to ablation.  Given prior tobacco use, will also assess cors with cardiac CT if able.  2. Tobacco Cessation advised He is not ready to quit   Current medicines are reviewed at length with the patient today.   The patient does not have concerns regarding his medicines.  The following changes were made today:  none   Signed, Hillis RangeJames Ashwath Lasch, MD  10/22/2016 3:08 PM     [facility] [address] [place] [address] [phone] (office) [phone] (fax)

## 2016-10-25 DIAGNOSIS — I480 Paroxysmal atrial fibrillation: Secondary | ICD-10-CM | POA: Diagnosis not present

## 2016-10-25 DIAGNOSIS — Z6823 Body mass index (BMI) 23.0-23.9, adult: Secondary | ICD-10-CM | POA: Diagnosis not present

## 2016-10-25 DIAGNOSIS — F17210 Nicotine dependence, cigarettes, uncomplicated: Secondary | ICD-10-CM | POA: Diagnosis not present

## 2016-10-25 DIAGNOSIS — J209 Acute bronchitis, unspecified: Secondary | ICD-10-CM | POA: Diagnosis not present

## 2016-10-31 ENCOUNTER — Telehealth: Payer: Self-pay | Admitting: Cardiology

## 2016-10-31 MED ORDER — DILTIAZEM HCL 30 MG PO TABS: 30.0000 mg | ORAL_TABLET | ORAL | 3 refills | Status: AC | PRN

## 2016-10-31 NOTE — Telephone Encounter (Signed)
I would give him short acting diltiazem 30mg  po q 8hrs prn only for palpitations, hopefullly occasional as needed doses will quite things down until his ablation   Dominga FerryJ Karessa Onorato MD

## 2016-10-31 NOTE — Telephone Encounter (Signed)
Patient notified and verbalized understanding. [facility] [place] [facility] [place]

## 2016-10-31 NOTE — Telephone Encounter (Signed)
Patient states he was seen by PCP last Friday for a URI, muscle pain and fever. Was given doxycycline (still taking) and a steroid shot. Patient states about 30 minutes later he felt his heart go out of rhythm. Patient states he could feel it out of rhythm all day Friday and Saturday. Finally felt it go back in rhythm Saturday evening. Patient states on Sunday heart went out of rhythm again and has been out since then. Per patient HR has been around 90. Patient has not had any other symptoms. Patient is just concerned that heart has never been out of rhythm this long before. Has ablation scheduled for 11/20/16.

## 2016-10-31 NOTE — Telephone Encounter (Signed)
Out of rhythm for about 3 days now.  Patient is concerned that he has not gone back . He has never gone this long before with it like this

## 2016-11-03 ENCOUNTER — Other Ambulatory Visit: Payer: Medicare Other

## 2016-11-03 DIAGNOSIS — I4891 Unspecified atrial fibrillation: Secondary | ICD-10-CM | POA: Diagnosis not present

## 2016-11-11 ENCOUNTER — Encounter: Payer: Self-pay | Admitting: Internal Medicine

## 2016-11-14 ENCOUNTER — Ambulatory Visit (HOSPITAL_COMMUNITY)
Admission: RE | Admit: 2016-11-14 | Discharge: 2016-11-14 | Disposition: A | Discharge status: Home / Self Care | Payer: Medicare Other | Source: Ambulatory Visit | Attending: Internal Medicine | Admitting: Internal Medicine

## 2016-11-14 ENCOUNTER — Telehealth: Payer: Self-pay | Admitting: *Deleted

## 2016-11-14 DIAGNOSIS — R911 Solitary pulmonary nodule: Secondary | ICD-10-CM | POA: Insufficient documentation

## 2016-11-14 DIAGNOSIS — I4891 Unspecified atrial fibrillation: Secondary | ICD-10-CM | POA: Diagnosis not present

## 2016-11-14 DIAGNOSIS — I480 Paroxysmal atrial fibrillation: Secondary | ICD-10-CM | POA: Diagnosis not present

## 2016-11-14 DIAGNOSIS — Z720 Tobacco use: Secondary | ICD-10-CM

## 2016-11-14 DIAGNOSIS — J432 Centrilobular emphysema: Secondary | ICD-10-CM | POA: Insufficient documentation

## 2016-11-14 DIAGNOSIS — I2510 Atherosclerotic heart disease of native coronary artery without angina pectoris: Secondary | ICD-10-CM | POA: Insufficient documentation

## 2016-11-14 DIAGNOSIS — I700 Atherosclerosis of aorta: Secondary | ICD-10-CM | POA: Diagnosis not present

## 2016-11-14 MED ORDER — METOPROLOL TARTRATE 5 MG/5ML IV SOLN
5.0000 mg | INTRAVENOUS | Status: DC | PRN
  Administered 2016-11-14: 5 mg via INTRAVENOUS
  Filled 2016-11-14: qty 5

## 2016-11-14 MED ORDER — IOPAMIDOL (ISOVUE-370) INJECTION 76%
INTRAVENOUS | Status: AC
  Administered 2016-11-14: 80 mL via INTRAVENOUS
  Filled 2016-11-14: qty 100

## 2016-11-14 MED ORDER — METOPROLOL TARTRATE 5 MG/5ML IV SOLN
INTRAVENOUS | Status: AC
  Filled 2016-11-14: qty 15

## 2016-11-14 MED ORDER — METOPROLOL TARTRATE 5 MG/5ML IV SOLN
INTRAVENOUS | Status: AC
  Filled 2016-11-14: qty 5

## 2016-11-14 NOTE — Telephone Encounter (Signed)
Pt walked into office c/o anxiety after CT scan done today and requesting "something that would ease my nerves" - also has upcoming ablation scheduled and family issues that pt says is contributing to anxiety - says he thought cardiology was best place to come since HR has been elevated - HR from nurse check was 79 - pt was visably anxious - but denied any symptoms - pt advised to check with pcp for anxiety medications - pt daughter was with him and says she would take him by pcp office

## 2016-11-15 DIAGNOSIS — R630 Anorexia: Secondary | ICD-10-CM | POA: Diagnosis not present

## 2016-11-15 DIAGNOSIS — Z6822 Body mass index (BMI) 22.0-22.9, adult: Secondary | ICD-10-CM | POA: Diagnosis not present

## 2016-11-15 DIAGNOSIS — F419 Anxiety disorder, unspecified: Secondary | ICD-10-CM | POA: Diagnosis not present

## 2016-11-19 ENCOUNTER — Telehealth: Payer: Self-pay | Admitting: Internal Medicine

## 2016-11-19 NOTE — Anesthesia Preprocedure Evaluation (Signed)
Anesthesia Evaluation  Patient identified by MRN, date of birth, ID band Patient awake    Reviewed: Allergy & Precautions, NPO status , Patient's Chart, lab work & pertinent test results  Airway Mallampati: II  TM Distance: >3 FB Neck ROM: Full    Dental no notable dental hx.    Pulmonary neg pulmonary ROS, Current Smoker,    Pulmonary exam normal breath sounds clear to auscultation       Cardiovascular + Peripheral Vascular Disease  negative cardio ROS Normal cardiovascular exam+ dysrhythmias Atrial Fibrillation  Rhythm:Irregular Rate:Normal     Neuro/Psych negative neurological ROS  negative psych ROS   GI/Hepatic negative GI ROS, Neg liver ROS, GERD  Medicated,  Endo/Other  negative endocrine ROS  Renal/GU negative Renal ROS  negative genitourinary   Musculoskeletal negative musculoskeletal ROS (+)   Abdominal   Peds negative pediatric ROS (+)  Hematology negative hematology ROS (+)   Anesthesia Other Findings ECHO 5/18  - Left ventricle: The cavity size was normal. Wall thickness was   normal. Systolic function was normal. The estimated ejection   fraction was in the range of 60% to 65%. Wall motion was normal;   there were no regional wall motion abnormalities.   Reproductive/Obstetrics negative OB ROS                             Anesthesia Physical Anesthesia Plan  ASA: III  Anesthesia Plan: MAC   Post-op Pain Management:    Induction: Intravenous  PONV Risk Score and Plan: 2 and Ondansetron, Dexamethasone and Treatment may vary due to age or medical condition  Airway Management Planned: Mask, Natural Airway and Nasal Cannula  Additional Equipment:   Intra-op Plan:   Post-operative Plan:   Informed Consent:   Plan Discussed with:   Anesthesia Plan Comments:         Anesthesia Quick Evaluation

## 2016-11-19 NOTE — Telephone Encounter (Signed)
Edward Young calling, states that has some questions about the postop process and will be heading out to the store to get things that he will need after surgery. Please call to discuss.

## 2016-11-19 NOTE — Telephone Encounter (Signed)
Returned call and left a message for patient to return my call.  Left message that he will spend the night and need someone to drive him home at discharge.  The discharge instructions will be given to him at hospital for post procedure care.  They can call back if needed

## 2016-11-20 ENCOUNTER — Ambulatory Visit (HOSPITAL_COMMUNITY)
Admission: RE | Admit: 2016-11-20 | Discharge: 2016-11-21 | Disposition: A | Discharge status: Home / Self Care | Payer: Medicare Other | Source: Ambulatory Visit | Attending: Internal Medicine | Admitting: Internal Medicine

## 2016-11-20 ENCOUNTER — Ambulatory Visit (HOSPITAL_COMMUNITY): Payer: Medicare Other | Admitting: Anesthesiology

## 2016-11-20 ENCOUNTER — Encounter (HOSPITAL_COMMUNITY): Payer: Self-pay | Admitting: Certified Registered Nurse Anesthetist

## 2016-11-20 ENCOUNTER — Encounter (HOSPITAL_COMMUNITY)
Admission: RE | Disposition: A | Discharge status: Home / Self Care | Payer: Self-pay | Source: Ambulatory Visit | Attending: Internal Medicine

## 2016-11-20 DIAGNOSIS — Z7982 Long term (current) use of aspirin: Secondary | ICD-10-CM | POA: Insufficient documentation

## 2016-11-20 DIAGNOSIS — F17210 Nicotine dependence, cigarettes, uncomplicated: Secondary | ICD-10-CM | POA: Diagnosis not present

## 2016-11-20 DIAGNOSIS — R071 Chest pain on breathing: Secondary | ICD-10-CM

## 2016-11-20 DIAGNOSIS — K219 Gastro-esophageal reflux disease without esophagitis: Secondary | ICD-10-CM | POA: Insufficient documentation

## 2016-11-20 DIAGNOSIS — I700 Atherosclerosis of aorta: Secondary | ICD-10-CM | POA: Diagnosis not present

## 2016-11-20 DIAGNOSIS — I739 Peripheral vascular disease, unspecified: Secondary | ICD-10-CM | POA: Diagnosis not present

## 2016-11-20 DIAGNOSIS — I480 Paroxysmal atrial fibrillation: Secondary | ICD-10-CM | POA: Insufficient documentation

## 2016-11-20 HISTORY — DX: Unspecified osteoarthritis, unspecified site: M19.90

## 2016-11-20 HISTORY — PX: ATRIAL FIBRILLATION ABLATION: EP1191

## 2016-11-20 HISTORY — DX: Pneumonia, unspecified organism: J18.9

## 2016-11-20 LAB — POCT ACTIVATED CLOTTING TIME: Activated Clotting Time: 158 seconds

## 2016-11-20 SURGERY — ATRIAL FIBRILLATION ABLATION
Anesthesia: Monitor Anesthesia Care

## 2016-11-20 MED ORDER — RIVAROXABAN 20 MG PO TABS
20.0000 mg | ORAL_TABLET | Freq: Every day | ORAL | Status: DC
Start: 2016-11-20 — End: 2016-11-21
  Administered 2016-11-20: 20 mg via ORAL
  Filled 2016-11-20: qty 1

## 2016-11-20 MED ORDER — PROPOFOL 500 MG/50ML IV EMUL
INTRAVENOUS | Status: DC | PRN
  Administered 2016-11-20: 80 ug/kg/min via INTRAVENOUS

## 2016-11-20 MED ORDER — FENTANYL CITRATE (PF) 100 MCG/2ML IJ SOLN
INTRAMUSCULAR | Status: DC | PRN
  Administered 2016-11-20 (×2): 25 ug via INTRAVENOUS

## 2016-11-20 MED ORDER — SODIUM CHLORIDE 0.9% FLUSH: 3.0000 mL | Freq: Two times a day (BID) | INTRAVENOUS | Status: DC

## 2016-11-20 MED ORDER — ISOPROTERENOL HCL 0.2 MG/ML IJ SOLN
INTRAMUSCULAR | Status: DC | PRN
  Administered 2016-11-20: 20 ug/min via INTRAVENOUS

## 2016-11-20 MED ORDER — HEPARIN SODIUM (PORCINE) 1000 UNIT/ML IJ SOLN
INTRAMUSCULAR | Status: DC | PRN
  Administered 2016-11-20: 12000 [IU] via INTRAVENOUS
  Administered 2016-11-20: 1000 [IU] via INTRAVENOUS

## 2016-11-20 MED ORDER — HYDROCODONE-ACETAMINOPHEN 5-325 MG PO TABS
1.0000 | ORAL_TABLET | ORAL | Status: DC | PRN
  Administered 2016-11-20 – 2016-11-21 (×3): 2 via ORAL
  Filled 2016-11-20 (×3): qty 2

## 2016-11-20 MED ORDER — SODIUM CHLORIDE 0.9 % IV SOLN
INTRAVENOUS | Status: DC | PRN
  Administered 2016-11-20: 06:00:00 via INTRAVENOUS

## 2016-11-20 MED ORDER — ONDANSETRON HCL 4 MG/2ML IJ SOLN
INTRAMUSCULAR | Status: DC | PRN
  Administered 2016-11-20: 4 mg via INTRAVENOUS

## 2016-11-20 MED ORDER — ONDANSETRON HCL 4 MG/2ML IJ SOLN: 4.0000 mg | Freq: Four times a day (QID) | INTRAMUSCULAR | Status: DC | PRN

## 2016-11-20 MED ORDER — IOPAMIDOL (ISOVUE-370) INJECTION 76%
INTRAVENOUS | Status: DC | PRN
  Administered 2016-11-20: 3 mL via INTRAVENOUS

## 2016-11-20 MED ORDER — ENSURE ENLIVE PO LIQD
237.0000 mL | Freq: Two times a day (BID) | ORAL | Status: DC
  Administered 2016-11-21: 09:00:00 237 mL via ORAL
  Filled 2016-11-20 (×5): qty 237

## 2016-11-20 MED ORDER — ACETAMINOPHEN 325 MG PO TABS: 650.0000 mg | ORAL_TABLET | ORAL | Status: DC | PRN

## 2016-11-20 MED ORDER — HEPARIN SODIUM (PORCINE) 1000 UNIT/ML IJ SOLN
INTRAMUSCULAR | Status: AC
  Filled 2016-11-20: qty 1

## 2016-11-20 MED ORDER — OFF THE BEAT BOOK
Freq: Once | Status: AC
  Administered 2016-11-20: 17:00:00
  Filled 2016-11-20: qty 1

## 2016-11-20 MED ORDER — HEPARIN SODIUM (PORCINE) 1000 UNIT/ML IJ SOLN
INTRAMUSCULAR | Status: DC | PRN
  Administered 2016-11-20: 1000 [IU] via INTRAVENOUS

## 2016-11-20 MED ORDER — BUPIVACAINE HCL (PF) 0.25 % IJ SOLN
INTRAMUSCULAR | Status: DC | PRN
  Administered 2016-11-20: 20 mL

## 2016-11-20 MED ORDER — PROTAMINE SULFATE 10 MG/ML IV SOLN
INTRAVENOUS | Status: DC | PRN
  Administered 2016-11-20: 10 mg via INTRAVENOUS
  Administered 2016-11-20: 20 mg via INTRAVENOUS

## 2016-11-20 MED ORDER — IOPAMIDOL (ISOVUE-370) INJECTION 76%
INTRAVENOUS | Status: AC
  Filled 2016-11-20: qty 50

## 2016-11-20 MED ORDER — SODIUM CHLORIDE 0.9% FLUSH: 3.0000 mL | INTRAVENOUS | Status: DC | PRN

## 2016-11-20 MED ORDER — ISOPROTERENOL HCL 0.2 MG/ML IJ SOLN
INTRAMUSCULAR | Status: AC
  Filled 2016-11-20: qty 5

## 2016-11-20 MED ORDER — SODIUM CHLORIDE 0.9 % IV SOLN: 250.0000 mL | INTRAVENOUS | Status: DC | PRN

## 2016-11-20 MED ORDER — MIDAZOLAM HCL 5 MG/5ML IJ SOLN
INTRAMUSCULAR | Status: DC | PRN
  Administered 2016-11-20: 2 mg via INTRAVENOUS

## 2016-11-20 MED ORDER — DOXYCYCLINE HYCLATE 100 MG PO TABS: 100.0000 mg | ORAL_TABLET | Freq: Two times a day (BID) | ORAL | Status: DC

## 2016-11-20 MED ORDER — LORAZEPAM 0.5 MG PO TABS: 0.5000 mg | ORAL_TABLET | Freq: Three times a day (TID) | ORAL | Status: DC | PRN

## 2016-11-20 SURGICAL SUPPLY — 18 items
BAG SNAP BAND KOVER 36X36 (MISCELLANEOUS) ×3 IMPLANT
BLANKET WARM UNDERBOD FULL ACC (MISCELLANEOUS) ×3 IMPLANT
CATH NAVISTAR SMARTTOUCH DF (ABLATOR) ×3 IMPLANT
CATH SOUNDSTAR ECO REPROCESSED (CATHETERS) ×3 IMPLANT
CATH VARIABLE LASSO NAV 2515 (CATHETERS) ×3 IMPLANT
CATH WEBSTER BI DIR CS D-F CRV (CATHETERS) ×3 IMPLANT
COVER SWIFTLINK CONNECTOR (BAG) ×3 IMPLANT
NEEDLE TRANSEP BRK 71CM 407200 (NEEDLE) ×3 IMPLANT
PACK EP LATEX FREE (CUSTOM PROCEDURE TRAY) ×2
PACK EP LF (CUSTOM PROCEDURE TRAY) ×1 IMPLANT
PAD DEFIB LIFELINK (PAD) ×3 IMPLANT
PATCH CARTO3 (PAD) ×3 IMPLANT
SHEATH AVANTI 11F 11CM (SHEATH) ×3 IMPLANT
SHEATH PINNACLE 7F 10CM (SHEATH) ×6 IMPLANT
SHEATH PINNACLE 9F 10CM (SHEATH) ×3 IMPLANT
SHEATH SWARTZ TS SL2 63CM 8.5F (SHEATH) ×3 IMPLANT
SHIELD RADPAD SCOOP 12X17 (MISCELLANEOUS) ×3 IMPLANT
TUBING SMART ABLATE COOLFLOW (TUBING) ×3 IMPLANT

## 2016-11-20 NOTE — Discharge Instructions (Signed)
No driving for 4 days. No lifting over 5 lbs for 1 week. No sexual activity for 1 week. You may return to work in 1 week. Keep procedure site clean & dry. If you notice increased pain, swelling, bleeding or pus, call/return!  You may shower, but no soaking baths/hot tubs/pools for 1 week.  ° ° °You have an appointment set up with the Atrial Fibrillation Clinic.  Multiple studies have shown that being followed by a dedicated atrial fibrillation clinic in addition to the standard care you receive from your other physicians improves health. We believe that enrollment in the atrial fibrillation clinic will allow us to better care for you.  ° °The phone number to the Atrial Fibrillation Clinic is [phone]. The clinic is staffed Monday through Friday from 8:30am to 5pm. ° °Parking Directions: The clinic is located in the Heart and Vascular Building connected to [facility]. °1)From [address] turn on to [address] and go to the 3rd entrance  (Heart and Vascular entrance) on the right. °2)Look to the right for Heart &Vascular Parking Garage. °3)A code for the entrance is required please call the clinic to receive this.   °4)Take the elevators to the 1st floor. Registration is in the room with the glass walls at the end of the hallway. ° °If you have any trouble parking or locating the clinic, please don’t hesitate to call [phone]. ° ° °

## 2016-11-20 NOTE — H&P (View-Only) (Signed)
Electrophysiology Office Note   Date:  10/22/2016   ID:  Edward BaileyJohn Carroll Danek, DOB [birth year], MRN [id number]  PCP:  Roma KayserSkillman, Katie, PA-C  Cardiologist:  Dr Diona BrownerMcDowell Primary Electrophysiologist: Hillis RangeJames Jaamal Farooqui, MD    Chief Complaint  Patient presents with  . Atrial Fibrillation     History of Present Illness: Edward Young is a 66 y.o. male who presents today for electrophysiology evaluation.   The patient is referred by Dr Diona BrownerMcDowell for EP consultation regarding his afib.  He reports having afib for more than 15 years.  He previously was treated with sotalol without success.  He states that he did not tolerate this medicine.  Records from 2008 from [place] suggest that he had syncope due to bradycardia on the medicine, though he does not recall this. Over the past 6 months, he has had increasing frequency and duration of atrial fibrillation.  Episodes now occur every 2-3 days, lasting up to 12 hours.  He reports symptoms of tachy palpitations, fatigue and decreased exercise tolerance.  He finds this very debilitating.  He was recently hospitalized for Afib with RVR.   Today, he denies symptoms of chest pain, shortness of breath, orthopnea, PND, lower extremity edema, claudication, dizziness, presyncope, syncope, bleeding, or neurologic sequela. The patient is tolerating medications without difficulties and is otherwise without complaint today.    Past Medical History:  Diagnosis Date  . GERD (gastroesophageal reflux disease)   . PAF (paroxysmal atrial fibrillation) (HCC)    Past Surgical History:  Procedure Laterality Date  . NOSE SURGERY       Current Outpatient Prescriptions  Medication Sig Dispense Refill  . aspirin EC 325 MG tablet Take 325 mg by mouth at bedtime.    . calcium carbonate (TUMS - DOSED IN MG ELEMENTAL CALCIUM) 500 MG chewable tablet Chew 1 tablet by mouth as needed for indigestion or heartburn.    . diltiazem (CARDIZEM CD) 120 MG 24 hr capsule Take  1 capsule (120 mg total) by mouth daily. 90 capsule 3   No current facility-administered medications for this visit.     Allergies:   Patient has no known allergies.   Social History:  The patient  reports that he has been smoking Cigarettes.  He started smoking about 47 years ago. He has been smoking about 1.00 pack per day. He has never used smokeless tobacco. He reports that he does not drink alcohol or use drugs.   Family History:  The patient's  family history includes Alzheimer's disease in his mother; Hypertension in his father; Kidney cancer in his maternal grandfather; Parkinson's disease in his brother; Pneumonia in his paternal grandmother.    ROS:  Please see the history of present illness.   All other systems are personally reviewed and negative.    PHYSICAL EXAM: VS:  BP 128/80   Pulse 83   Ht 5\' 7"  (1.702 m)   Wt 156 lb 3.2 oz (70.9 kg)   BMI 24.46 kg/m  , BMI Body mass index is 24.46 kg/m. GEN: Well nourished, well developed, in no acute distress  HEENT: normal  Neck: no JVD, carotid bruits, or masses Cardiac: RRR; no murmurs, rubs, or gallops,no edema  Respiratory:  clear to auscultation bilaterally, normal work of breathing GI: soft, nontender, nondistended, + BS MS: no deformity or atrophy  Skin: warm and dry  Neuro:  Strength and sensation are intact Psych: euthymic mood, full affect   Recent Labs: 09/07/2016: BUN 5; Creatinine, Ser 0.93; Hemoglobin 18.7; Platelets  247; Potassium 3.5; Sodium 137; TSH 4.788  personally reviewed   Lipid Panel  No results found for: CHOL, TRIG, HDL, CHOLHDL, VLDL, LDLCALC, LDLDIRECT personally reviewed   Wt Readings from Last 3 Encounters:  10/22/16 156 lb 3.2 oz (70.9 kg)  09/18/16 159 lb 6.4 oz (72.3 kg)  09/08/16 156 lb 8.4 oz (71 kg)      Other studies personally reviewed: Additional studies/ records that were reviewed today include: echo in epic, recent hospital records, Dr McDowell's records  Review of the  above records today demonstrates: as above   ASSESSMENT AND PLAN:  1.  Paroxysmal atrial fibrillation The patient has symptomatic atrial fibrillation.  He has failed medical therapy with sotalol.  Further therapy has been limited by bradycardia.  chads2vasc score is 1.  He has therefore not previously been on anticoagulation.  Therapeutic strategies for afib including medicine and ablation were discussed in detail with the patient today. Risk, benefits, and alternatives to EP study and radiofrequency ablation for afib were also discussed in detail today. These risks include but are not limited to stroke, bleeding, vascular damage, tamponade, perforation, damage to the esophagus, lungs, and other structures, pulmonary vein stenosis, worsening renal function, and death. The patient understands these risk and wishes to proceed.  We will stop ASA and start xarelto 20mg  daily today.  We will therefore proceed with catheter ablation once the patient has been adequately anticoagulated.  Cardiac CT prior to ablation.  Given prior tobacco use, will also assess cors with cardiac CT if able.  2. Tobacco Cessation advised He is not ready to quit   Current medicines are reviewed at length with the patient today.   The patient does not have concerns regarding his medicines.  The following changes were made today:  none   Signed, Hillis RangeJames Kaleah Hagemeister, MD  10/22/2016 3:08 PM     [facility] [address] [place] [address] [phone] (office) [phone] (fax)

## 2016-11-20 NOTE — Interval H&P Note (Signed)
History and Physical Interval Note:  11/20/2016 7:22 AM  Edward Young  has presented today for surgery, with the diagnosis of afib  The various methods of treatment have been discussed with the patient and family. After consideration of risks, benefits and other options for treatment, the patient has consented to  Procedure(s): Atrial Fibrillation Ablation (N/A) as a surgical intervention .  The patient's history has been reviewed, patient examined, no change in status, stable for surgery.  I have reviewed the patient's chart and labs.  Questions were answered to the patient's satisfaction.     Hillis RangeJames Aliegha Paullin

## 2016-11-20 NOTE — Progress Notes (Addendum)
Site area: RFV x 3 Site Prior to Removal:  Level 0 Pressure Applied For:20 min Manual:yes  Patient Status During Pull:stable   Post Pull Site:  Level 0 Post Pull Instructions Given:  yes Post Pull Pulses Present: palpable rt pt Dressing Applied:  tegaderm Bedrest begins @ 1130 till 1730 Comments:

## 2016-11-20 NOTE — Care Management Note (Addendum)
Case Management Note  Patient Details  Name: Sudie BaileyJohn Carroll Vonbehren MRN: [id number] Date of Birth: 06/05/1950  Subjective/Objective:   From home alone,pta indep s/p Atrial Fibrillation Ablation, was already taking xarelto at home.                   Action/Plan:  NCM will follow for dc needs.   Expected Discharge Date:                  Expected Discharge Plan:  Home/Self Care  In-House Referral:     Discharge planning Services  CM Consult  Post Acute Care Choice:    Choice offered to:     DME Arranged:    DME Agency:     HH Arranged:    HH Agency:     Status of Service:  Completed, signed off  If discussed at [employer] Length of Stay Meetings, dates discussed:    Additional Comments:  Leone Havenaylor, Diontre Harps Clinton, RN 11/20/2016, 3:20 PM

## 2016-11-20 NOTE — Discharge Summary (Signed)
ELECTROPHYSIOLOGY PROCEDURE DISCHARGE SUMMARY    Patient ID: Edward Young,  MRN: [id number], DOB/AGE: Jan 04, 1951 66 y.o.  Admit date: 11/20/2016 Discharge date: 11/21/2016  Primary Care Physician: Roma KayserSkillman, Katie, PA-C Primary Cardiologist: Diona BrownerMcDowell Electrophysiologist: Hillis RangeJames Dohn Stclair, MD  Primary Discharge Diagnosis:  Paroxysmal atrial fibrillation status post AF ablation this admission  Secondary Discharge Diagnosis:  1.  Tobacco abuse   Procedures This Admission:  1.  Electrophysiology study and radiofrequency catheter ablation on 11/20/16 by Dr Hillis RangeJames Keitha Kolk.  This study demonstrated sinus rhythm upon presentation; intracardiac echo reveals a moderate sized left atrium with four separate pulmonary veins without evidence of pulmonary vein stenosis; successful electrical isolation and anatomical encircling of all four pulmonary veins with radiofrequency current; no inducible arrhythmias following ablation both on and off of Isuprel; no early apparent complications.  Brief HPI: Edward Young is a 66 y.o. male with a history of paroxysmal atrial fibrillation.  They have failed medical therapy with amiodarone. Risks, benefits, and alternatives to catheter ablation of atrial fibrillation were reviewed with the patient who wished to proceed.  The patient underwent cardiac CT prior to the procedure which demonstrated no LAA thrombus.    Hospital Course:  The patient was admitted and underwent EPS/RFCA of atrial fibrillation with details as outlined above.  They were monitored on telemetry overnight which demonstrated sinus rhythm.  Groin was without complication on the day of discharge.  The patient was examined and considered to be stable for discharge.  Wound care and restrictions were reviewed with the patient.  The patient will be seen back by Rudi Cocoonna Carroll, NP in 4 weeks and Dr Johney FrameAllred in 12 weeks for post ablation follow up.   He did have pleuritic chest pain overnight  that is worse with exertion. Evaluated by fellow with bedside echo which demonstrated no effusion. CXR unremarkable. His pain is consistent with pleurisy that he has had in the past. Pt seen by Dr Ladona Ridgelaylor who felt he was stable for discharge to home. Tylenol advised and patient will call AF clinic on Monday if pain persistent. No trouble swallowing.  BP 111/68 this morning.   This patients CHA2DS2-VASc Score and unadjusted Ischemic Stroke Rate (% per year) is equal to 0.6 % stroke rate/year from a score of 1 Above score calculated as 1 point each if present [CHF, HTN, DM, Vascular=MI/PAD/Aortic Plaque, Age if 65-74, or Male] Above score calculated as 2 points each if present [Age > 75, or Stroke/TIA/TE]   Physical Exam: Vitals:   11/21/16 0315 11/21/16 0325 11/21/16 0435 11/21/16 0650  BP:   106/62 (!) 88/59  Pulse:   74 66  Resp:   15 19  Temp:  98.4 F (36.9 C)    TempSrc:  Oral    SpO2: 96%  92% 93%  Weight:      Height:        GEN- The patient is well appearing, alert and oriented x 3 today.   HEENT: normocephalic, atraumatic; sclera clear, conjunctiva pink; hearing intact; oropharynx clear; neck supple  Lungs- Clear to ausculation bilaterally, normal work of breathing.  No wheezes, rales, rhonchi Heart- Regular rate and rhythm, no murmurs, rubs or gallops  GI- soft, non-tender, non-distended, bowel sounds present  Extremities- no clubbing, cyanosis, or edema; DP/PT/radial pulses 2+ bilaterally, groin without hematoma/bruit MS- no significant deformity or atrophy Skin- warm and dry, no rash or lesion Psych- euthymic mood, full affect Neuro- strength and sensation are intact   Labs:   Lab Results  Component Value Date   WBC 7.6 09/07/2016   HGB 18.7 (H) 09/07/2016   HCT 52.6 (H) 09/07/2016   MCV 97.0 09/07/2016   PLT 247 09/07/2016   No results for input(s): NA, K, CL, CO2, BUN, CREATININE, CALCIUM, PROT, BILITOT, ALKPHOS, ALT, AST, GLUCOSE in the last 168  hours.  Invalid input(s): LABALBU   Discharge Medications:  Allergies as of 11/21/2016   No Known Allergies     Medication List    TAKE these medications   acetaminophen 500 MG tablet Commonly known as:  TYLENOL Take 500 mg by mouth every 6 (six) hours as needed for moderate pain or headache.   benzonatate 100 MG capsule Commonly known as:  TESSALON Take 100 mg by mouth 3 (three) times daily as needed.   calcium carbonate 500 MG chewable tablet Commonly known as:  TUMS - dosed in mg elemental calcium Chew 1 tablet by mouth as needed for indigestion or heartburn.   diltiazem 120 MG 24 hr capsule Commonly known as:  CARDIZEM CD Take 1 capsule (120 mg total) by mouth daily.   diltiazem 30 MG tablet Commonly known as:  CARDIZEM Take 1 tablet (30 mg total) by mouth as needed (palpitations).   doxycycline 100 MG tablet Commonly known as:  VIBRA-TABS Take 100 mg by mouth 2 (two) times daily.   LORazepam 1 MG tablet Commonly known as:  ATIVAN Take 0.5 mg by mouth every 8 (eight) hours as needed for anxiety.   pantoprazole 40 MG tablet Commonly known as:  PROTONIX Take 1 tablet (40 mg total) by mouth daily.   rivaroxaban 20 MG Tabs tablet Commonly known as:  XARELTO Take 1 tablet (20 mg total) by mouth daily with supper.       Disposition:  Discharge Instructions    Diet - low sodium heart healthy    Complete by:  As directed    Increase activity slowly    Complete by:  As directed      Follow-up Information    [facility] Follow up on 12/22/2016.   Specialty:  Cardiology Why:  at [facility]  Contact information: [address] [id number] [place] [id number] [phone]       Hillis RangeAllred, Barba Solt, MD Follow up on 02/23/2017.   Specialty:  Cardiology Why:  at 1:45PM  Contact information: [address] [place] [address] [phone]           Duration of Discharge Encounter: Greater than 30  minutes including physician time.  Signed, Gypsy BalsamAmber Seiler, NP 11/21/2016 7:37 AM

## 2016-11-20 NOTE — Anesthesia Procedure Notes (Signed)
Procedure Name: MAC Date/Time: 11/20/2016 7:40 AM Performed by: Salli Quarry Amera Banos Pre-anesthesia Checklist: Patient identified, Emergency Drugs available, Suction available and Patient being monitored Patient Re-evaluated:Patient Re-evaluated prior to induction Oxygen Delivery Method: Simple face mask

## 2016-11-20 NOTE — Transfer of Care (Signed)
Immediate Anesthesia Transfer of Care Note  Patient: Edward Young  Procedure(s) Performed: Procedure(s): Atrial Fibrillation Ablation (N/A)  Patient Location: PACU  Anesthesia Type:MAC  Level of Consciousness: awake, alert  and patient cooperative  Airway & Oxygen Therapy: Patient Spontanous Breathing  Post-op Assessment: Report given to RN and Post -op Vital signs reviewed and stable  Post vital signs: Reviewed and stable  Last Vitals:  Vitals:   11/20/16 0540  BP: 112/83  Pulse: 75  Temp: 36.4 C    Last Pain: There were no vitals filed for this visit.    Patients Stated Pain Goal: 3 (07/62/26 3335)  Complications: No apparent anesthesia complications

## 2016-11-20 NOTE — Anesthesia Postprocedure Evaluation (Signed)
Anesthesia Post Note  Patient: Edward Young  Procedure(s) Performed: Procedure(s) (LRB): Atrial Fibrillation Ablation (N/A)     Patient location during evaluation: PACU Anesthesia Type: MAC Level of consciousness: awake and alert Pain management: pain level controlled Vital Signs Assessment: post-procedure vital signs reviewed and stable Respiratory status: spontaneous breathing, nonlabored ventilation, respiratory function stable and patient connected to nasal cannula oxygen Cardiovascular status: stable and blood pressure returned to baseline Anesthetic complications: no    Last Vitals:  Vitals:   11/20/16 1041 11/20/16 1045  BP:  (!) 94/49  Pulse:  (!) 56  Resp:  20  Temp: (!) 36.3 C     Last Pain:  Vitals:   11/20/16 1041  TempSrc: Temporal                 Pallas Wahlert

## 2016-11-21 ENCOUNTER — Ambulatory Visit (HOSPITAL_COMMUNITY): Payer: Medicare Other

## 2016-11-21 DIAGNOSIS — K219 Gastro-esophageal reflux disease without esophagitis: Secondary | ICD-10-CM | POA: Diagnosis not present

## 2016-11-21 DIAGNOSIS — F17210 Nicotine dependence, cigarettes, uncomplicated: Secondary | ICD-10-CM | POA: Diagnosis not present

## 2016-11-21 DIAGNOSIS — I739 Peripheral vascular disease, unspecified: Secondary | ICD-10-CM | POA: Diagnosis not present

## 2016-11-21 DIAGNOSIS — I480 Paroxysmal atrial fibrillation: Secondary | ICD-10-CM | POA: Diagnosis not present

## 2016-11-21 DIAGNOSIS — Z7982 Long term (current) use of aspirin: Secondary | ICD-10-CM | POA: Diagnosis not present

## 2016-11-21 DIAGNOSIS — R079 Chest pain, unspecified: Secondary | ICD-10-CM | POA: Diagnosis not present

## 2016-11-21 MED ORDER — PANTOPRAZOLE SODIUM 40 MG PO TBEC: 40.0000 mg | DELAYED_RELEASE_TABLET | Freq: Every day | ORAL | 0 refills | Status: DC

## 2016-11-21 NOTE — Care Management Note (Signed)
Case Management Note  Patient Details  Name: Edward Young MRN: [id number] Date of Birth: Mar 02, 1951  Subjective/Objective:                 Patient with order to DC to home today. Chart reviewed. No Home Health or Equipment needs, no unacknowledged Case Management consults or medication needs identified at the time of this note. Plan for DC to home. If needs arise today prior to discharge, please call Lawerance SabalDebbie Eligha Kmetz RN CM at [phone].    Action/Plan:   Expected Discharge Date:  11/21/16               Expected Discharge Plan:  Home/Self Care  In-House Referral:     Discharge planning Services  CM Consult  Post Acute Care Choice:    Choice offered to:     DME Arranged:    DME Agency:     HH Arranged:    HH Agency:     Status of Service:  Completed, signed off  If discussed at [employer] Length of Stay Meetings, dates discussed:    Additional Comments:  Lawerance SabalDebbie Evelena Masci, RN 11/21/2016, 8:52 AM

## 2016-11-21 NOTE — Progress Notes (Signed)
Dr Tiburcio PeaHarris in to see and evaluate pt. Bedside Echo done by Dr. Tiburcio PeaHarris.  New orders obtained. Stat portable X-Ray done as ordered. Pt more comfortable. Pain now 2/10.

## 2016-11-21 NOTE — Progress Notes (Signed)
Called due to patient having severe substernal chest pain.  He is s/p afib ablation earlier in the day.  The pain is positional in nature and associated with deep inspiration.  On my exam his pain has resolved.  Vitals have been stable through the night.  Bedside ECHO with suboptimal images, however, no obvious pericardial effusion noted.  Will get a CXR now and limited ECHO in the morning.  On further questioning he states some of the pain may be more in his back.  Sounds more MSK in nature.    Link SnufferGregory Daniell Paradise, MD, PhD Cardiology

## 2016-11-21 NOTE — Progress Notes (Signed)
Pt awakened with c/o severe sharp mid sternal chest pain on inspiration. VS obtained EKG done. Pt medicated with 2 Vicodin.

## 2016-11-24 LAB — POCT ACTIVATED CLOTTING TIME
ACTIVATED CLOTTING TIME: 400 s
Activated Clotting Time: 351 seconds

## 2016-12-15 ENCOUNTER — Encounter: Payer: Self-pay | Admitting: Cardiology

## 2016-12-15 NOTE — Progress Notes (Signed)
Cardiology Office Note  Date: 12/16/2016   ID: Edward Young, DOB November 19, 1950, MRN [id number]  PCP: Roma Kayser, PA-C  Primary Cardiologist: [name], MD   Chief Complaint  Patient presents with  . Atrial Fibrillation    History of Present Illness: Edward Young is a 66 y.o. male last seen in May. I referred him to see Dr. Johney Frame to discuss treatment options for symptomatic paroxysmal atrial fibrillation. He had failed sotalol in the past with history of bradycardia. Ultimately, patient underwent atrial fibrillation ablation on July 26. Cardiac CT done prior to the procedure did not demonstrate left atrial appendage thrombus. He was transitioned from aspirin to Xarelto by Dr. Johney Frame.  He presents today for follow-up, states that he feels "great." He tells me that he ran out of Xarelto and did not get it refilled because it was too expensive. His insurance does not cover medications. I did talk with him about other options, but he does not want to go back on anticoagulation prefers to stay on aspirin at this time. Thromboembolic risk is relatively low however, CHADSVASC score of 1.  He has follow-up artery arranged in the atrial fibrillation clinic and with Dr. Johney Frame.  Past Medical History:  Diagnosis Date  . Arthritis   . GERD (gastroesophageal reflux disease)   . PAF (paroxysmal atrial fibrillation) (HCC)   . Pneumonia 2016    Past Surgical History:  Procedure Laterality Date  . ATRIAL FIBRILLATION ABLATION N/A 11/20/2016   Procedure: Atrial Fibrillation Ablation;  Surgeon: Hillis Range, MD;  [facility] IN[facility]ASIVE CV LAB;  Service: Cardiovascular;  Laterality: N/A;  . FRACTURE SURGERY    . NASAL FRACTURE SURGERY  ~ 2003   "opened it up so I could breath; it had been broken ~ 3 times"    Current Outpatient Prescriptions  Medication Sig Dispense Refill  . calcium carbonate (TUMS - DOSED IN MG ELEMENTAL CALCIUM) 500 MG chewable tablet Chew 1 tablet  by mouth as needed for indigestion or heartburn.    . diltiazem (CARDIZEM CD) 120 MG 24 hr capsule Take 1 capsule (120 mg total) by mouth daily. 90 capsule 3  . diltiazem (CARDIZEM) 30 MG tablet Take 1 tablet (30 mg total) by mouth as needed (palpitations). 30 tablet 3  . LORazepam (ATIVAN) 1 MG tablet Take 0.5 mg by mouth daily.      No current facility-administered medications for this visit.    Allergies:  Patient has no known allergies.   Social History: The patient  reports that he has been smoking Cigarettes.  He started smoking about 47 years ago. He has a 23.50 pack-year smoking history. He has never used smokeless tobacco. He reports that he does not drink alcohol or use drugs.   ROS:  Please see the history of present illness. Otherwise, complete review of systems is positive for none.  All other systems are reviewed and negative.   Physical Exam: VS:  BP (!) 130/92   Pulse 87   Ht 5\' 8"  (1.727 m)   Wt 149 lb 12.8 oz (67.9 kg)   SpO2 96%   BMI 22.78 kg/m , BMI Body mass index is 22.78 kg/m.  Wt Readings from Last 3 Encounters:  12/16/16 149 lb 12.8 oz (67.9 kg)  11/20/16 145 lb (65.8 kg)  10/22/16 156 lb 3.2 oz (70.9 kg)    General: Patient appears comfortable at rest. HEENT: Conjunctiva and lids normal, oropharynx clear with moist mucosa. Neck: Supple, no elevated JVP or  carotid bruits, no thyromegaly. Lungs: Clear to auscultation, nonlabored breathing at rest. Cardiac: Regular rate and rhythm, no S3 or significant systolic murmur, no pericardial rub. Abdomen: Soft, nontender, bowel sounds present, no guarding or rebound. Extremities: No pitting edema, distal pulses 2+. Skin: Warm and dry. Musculoskeletal: No kyphosis. Neuropsychiatric: Alert and oriented x3, affect grossly appropriate.  ECG: I personally reviewed the tracing from 11/21/2016 which showed normal sinus rhythm.  Recent Labwork: 09/07/2016: BUN 5; Creatinine, Ser 0.93; Hemoglobin 18.7; Platelets 247;  Potassium 3.5; Sodium 137; TSH 4.788   Other Studies Reviewed Today:  Echocardiogram 09/08/2016: Study Conclusions  - Left ventricle: The cavity size was normal. Wall thickness was   normal. Systolic function was normal. The estimated ejection   fraction was in the range of 60% to 65%. Wall motion was normal;   there were no regional wall motion abnormalities. The study was   not technically sufficient to allow evaluation of LV diastolic   dysfunction due to atrial fibrillation. - Mitral valve: Mildly calcified annulus.  Assessment and Plan:  1. History of paroxysmal atrial fibrillation with CHADSVASC score of 1. He is now status post successful ablation with Dr. Johney Frame and feels much better. He stopped Xarelto as mentioned above citing high cost (and no prescription coverage), did not want to pursue other alternatives at this time and will stay on aspirin for now. Keep follow-up in the atrial fib relation clinic and with Dr. Johney Frame.  2. Tobacco abuse, smoking cessation discussed.  Current medicines were reviewed with the patient today.  Disposition: Follow-up in 6 months.   Signed, Jonelle Sidle, MD, [facility] 12/16/2016 3:33 PM    [facility] at [facility] [address] [place], [place], [place] [address] Phone: [phone]; Fax: [phone]

## 2016-12-16 ENCOUNTER — Ambulatory Visit (INDEPENDENT_AMBULATORY_CARE_PROVIDER_SITE_OTHER): Payer: Medicare Other | Admitting: Cardiology

## 2016-12-16 ENCOUNTER — Encounter: Payer: Self-pay | Admitting: Cardiology

## 2016-12-16 VITALS — BP 130/92 | HR 87 | Ht 68.0 in | Wt 149.8 lb

## 2016-12-16 DIAGNOSIS — I480 Paroxysmal atrial fibrillation: Secondary | ICD-10-CM | POA: Diagnosis not present

## 2016-12-16 DIAGNOSIS — Z720 Tobacco use: Secondary | ICD-10-CM

## 2016-12-16 MED ORDER — ASPIRIN EC 81 MG PO TBEC: 81.0000 mg | DELAYED_RELEASE_TABLET | Freq: Every day | ORAL | 3 refills | Status: DC

## 2016-12-16 NOTE — Patient Instructions (Signed)
Medication Instructions:   Your physician has recommended you make the following change in your medication:   Begin taking aspirin 81 mg daily  Please continue all other medications as prescribed  Labwork: none  Testing/Procedures: none  Follow-Up: Your physician wants you to follow-up in: 6 MONTHS WITH DR. Diona Browner You will receive a reminder letter in the mail two months in advance. If you don't receive a letter, please call our office to schedule the follow-up appointment.  Any Other Special Instructions Will Be Listed Below (If Applicable).  If you need a refill on your cardiac medications before your next appointment, please call your pharmacy.

## 2016-12-22 ENCOUNTER — Ambulatory Visit (HOSPITAL_COMMUNITY): Payer: Medicare Other | Admitting: Nurse Practitioner

## 2017-01-07 ENCOUNTER — Inpatient Hospital Stay (HOSPITAL_COMMUNITY): Admission: RE | Admit: 2017-01-07 | Payer: Medicare Other | Source: Ambulatory Visit | Admitting: Nurse Practitioner

## 2017-01-08 ENCOUNTER — Telehealth (HOSPITAL_COMMUNITY): Payer: Self-pay | Admitting: *Deleted

## 2017-01-08 NOTE — Telephone Encounter (Signed)
Daughter called to cancel upcoming appointment with afib clinic. States pt saw Dr. Diona BrownerMcDowell 1 month after ablation and was doing fine. Noted in notes that patient was no longer on Xarelto--- questioned daughter on this and she states Dr. Diona BrownerMcDowell told pt it was ok to just be on aspirin since he could not afford Xarelto. Educated daughter on importance of DOAC post ablation for the first 3 months due to increased stroke risk and daughter states they just cannot afford $400 a month for one medication. Offered samples but daughter states they live more than 2 hours away and cannot come for samples. Forwarded information to Dr. Johney FrameAllred. Daughter states they will keep upcoming appointment with Dr. Johney FrameAllred as scheduled in October.

## 2017-01-20 ENCOUNTER — Ambulatory Visit (HOSPITAL_COMMUNITY): Payer: Medicare Other | Admitting: Nurse Practitioner

## 2017-02-23 ENCOUNTER — Encounter: Payer: Self-pay | Admitting: Internal Medicine

## 2017-02-23 ENCOUNTER — Ambulatory Visit (INDEPENDENT_AMBULATORY_CARE_PROVIDER_SITE_OTHER): Payer: Medicare Other | Admitting: Internal Medicine

## 2017-02-23 VITALS — BP 112/84 | HR 68 | Ht 68.0 in | Wt 145.2 lb

## 2017-02-23 DIAGNOSIS — Z720 Tobacco use: Secondary | ICD-10-CM

## 2017-02-23 DIAGNOSIS — I480 Paroxysmal atrial fibrillation: Secondary | ICD-10-CM | POA: Diagnosis not present

## 2017-02-23 NOTE — Patient Instructions (Signed)
Medication Instructions:  Your physician recommends that you continue on your current medications as directed. Please refer to the Current Medication list given to you today.  -- If you need a refill on your cardiac medications before your next appointment, please call your pharmacy. --  Labwork: None ordered  Testing/Procedures: None ordered  Follow-Up: Your physician wants you to follow-up in: 3 months with Dr. Allred.  You will receive a reminder letter in the mail two months in advance. If you don't receive a letter, please call our office to schedule the follow-up appointment.  Thank you for choosing [facility]!!   Armstrong Creasy, RN [phone]  Any Other Special Instructions Will Be Listed Below (If Applicable).         

## 2017-02-23 NOTE — Progress Notes (Signed)
   PCP: Roma KayserSkillman, Katie, PA-C Primary Cardiologist: Dr Jeanie SewerMcDowell  Edward Young is a 66 y.o. male who presents today for routine electrophysiology followup.  Since his recent afib ablation, the patient reports doing very well.  he denies procedure related complications and is pleased with the results of the procedure.  Unfortunately, he was not compliant with post ablation instructions and stopped his anticoagulant prematurely.  He continues to smoke at least 1 ppd. Today, he denies symptoms of palpitations, chest pain, shortness of breath,  lower extremity edema, dizziness, presyncope, or syncope.  The patient is otherwise without complaint today.   Past Medical History:  Diagnosis Date  . Arthritis   . GERD (gastroesophageal reflux disease)   . PAF (paroxysmal atrial fibrillation) (HCC)   . Pneumonia 2016   Past Surgical History:  Procedure Laterality Date  . ATRIAL FIBRILLATION ABLATION N/A 11/20/2016   Procedure: Atrial Fibrillation Ablation;  Surgeon: Hillis RangeAllred, Joslyn Ramos, MD;  [facility];  Service: Cardiovascular;  Laterality: N/A;  . FRACTURE SURGERY    . NASAL FRACTURE SURGERY  ~ 2003   "opened it up so I could breath; it had been broken ~ 3 times"    ROS- all systems are personally reviewed and negatives except as per HPI above  Current Outpatient Prescriptions  Medication Sig Dispense Refill  . aspirin EC 81 MG tablet Take 1 tablet (81 mg total) by mouth daily. 90 tablet 3  . calcium carbonate (TUMS - DOSED IN MG ELEMENTAL CALCIUM) 500 MG chewable tablet Chew 1 tablet by mouth as needed for indigestion or heartburn.    . diltiazem (CARDIZEM CD) 120 MG 24 hr capsule Take 1 capsule (120 mg total) by mouth daily. 90 capsule 3  . diltiazem (CARDIZEM) 30 MG tablet Take 1 tablet (30 mg total) by mouth as needed (palpitations). 30 tablet 3  . LORazepam (ATIVAN) 1 MG tablet Take 0.5 mg by mouth daily.      No current facility-administered medications for this visit.      Physical Exam: Vitals:   02/23/17 1341  BP: 112/84  Pulse: 68  SpO2: 96%  Weight: 145 lb 3.2 oz (65.9 kg)  Height: 5\' 8"  (1.727 m)    GEN- The patient is well appearing, alert and oriented x 3 today.   Head- normocephalic, atraumatic Eyes-  Sclera clear, conjunctiva pink Ears- hearing intact Oropharynx- clear Lungs- Clear to ausculation bilaterally, normal work of breathing Heart- Regular rate and rhythm, no murmurs, rubs or gallops, PMI not laterally displaced GI- soft, NT, ND, + BS Extremities- no clubbing, cyanosis, or edema Hands are stained from cigarettes  EKG tracing ordered today is personally reviewed and shows sinus rhythm 68 bpm, LAD, otherwise normal ekg  Assessment and Plan:  1. Paroxysmal atrial fibrillation Doing well s/p ablation off AAD therapy  He did not comply with post ablation anticoagulation recommendations.  I am thankful that he did not have a stroke secondary to his noncompliance.  I am not sure that he would be a candidate for additional ablation if required due to this. chads2vasc score is 1.   He is not on anticoagulation at this time.  2. Tobacco Cessation advised.  He is not ready to quit. 4mm Pulmonary nodule on chest CT was discussed with the patient again today.  I have advised that he arrange noncontrast CT follow-up with PCP in 9 months  Return to see me in 3 months  Hillis RangeJames Shalina Norfolk MD, [facility] 02/23/2017 1:51 PM

## 2017-05-27 ENCOUNTER — Ambulatory Visit: Payer: Medicare Other | Admitting: Internal Medicine

## 2017-06-02 IMAGING — DX DG CHEST 2V
2 series · 2 of 2 positions shown · non-contrast
Comparison: None.

CLINICAL DATA: Palpitations, pt was on heart monitor, today the
monitor service informed him to come to the ER, tachycardia

EXAM:
CHEST  2 VIEW

[chest pa]
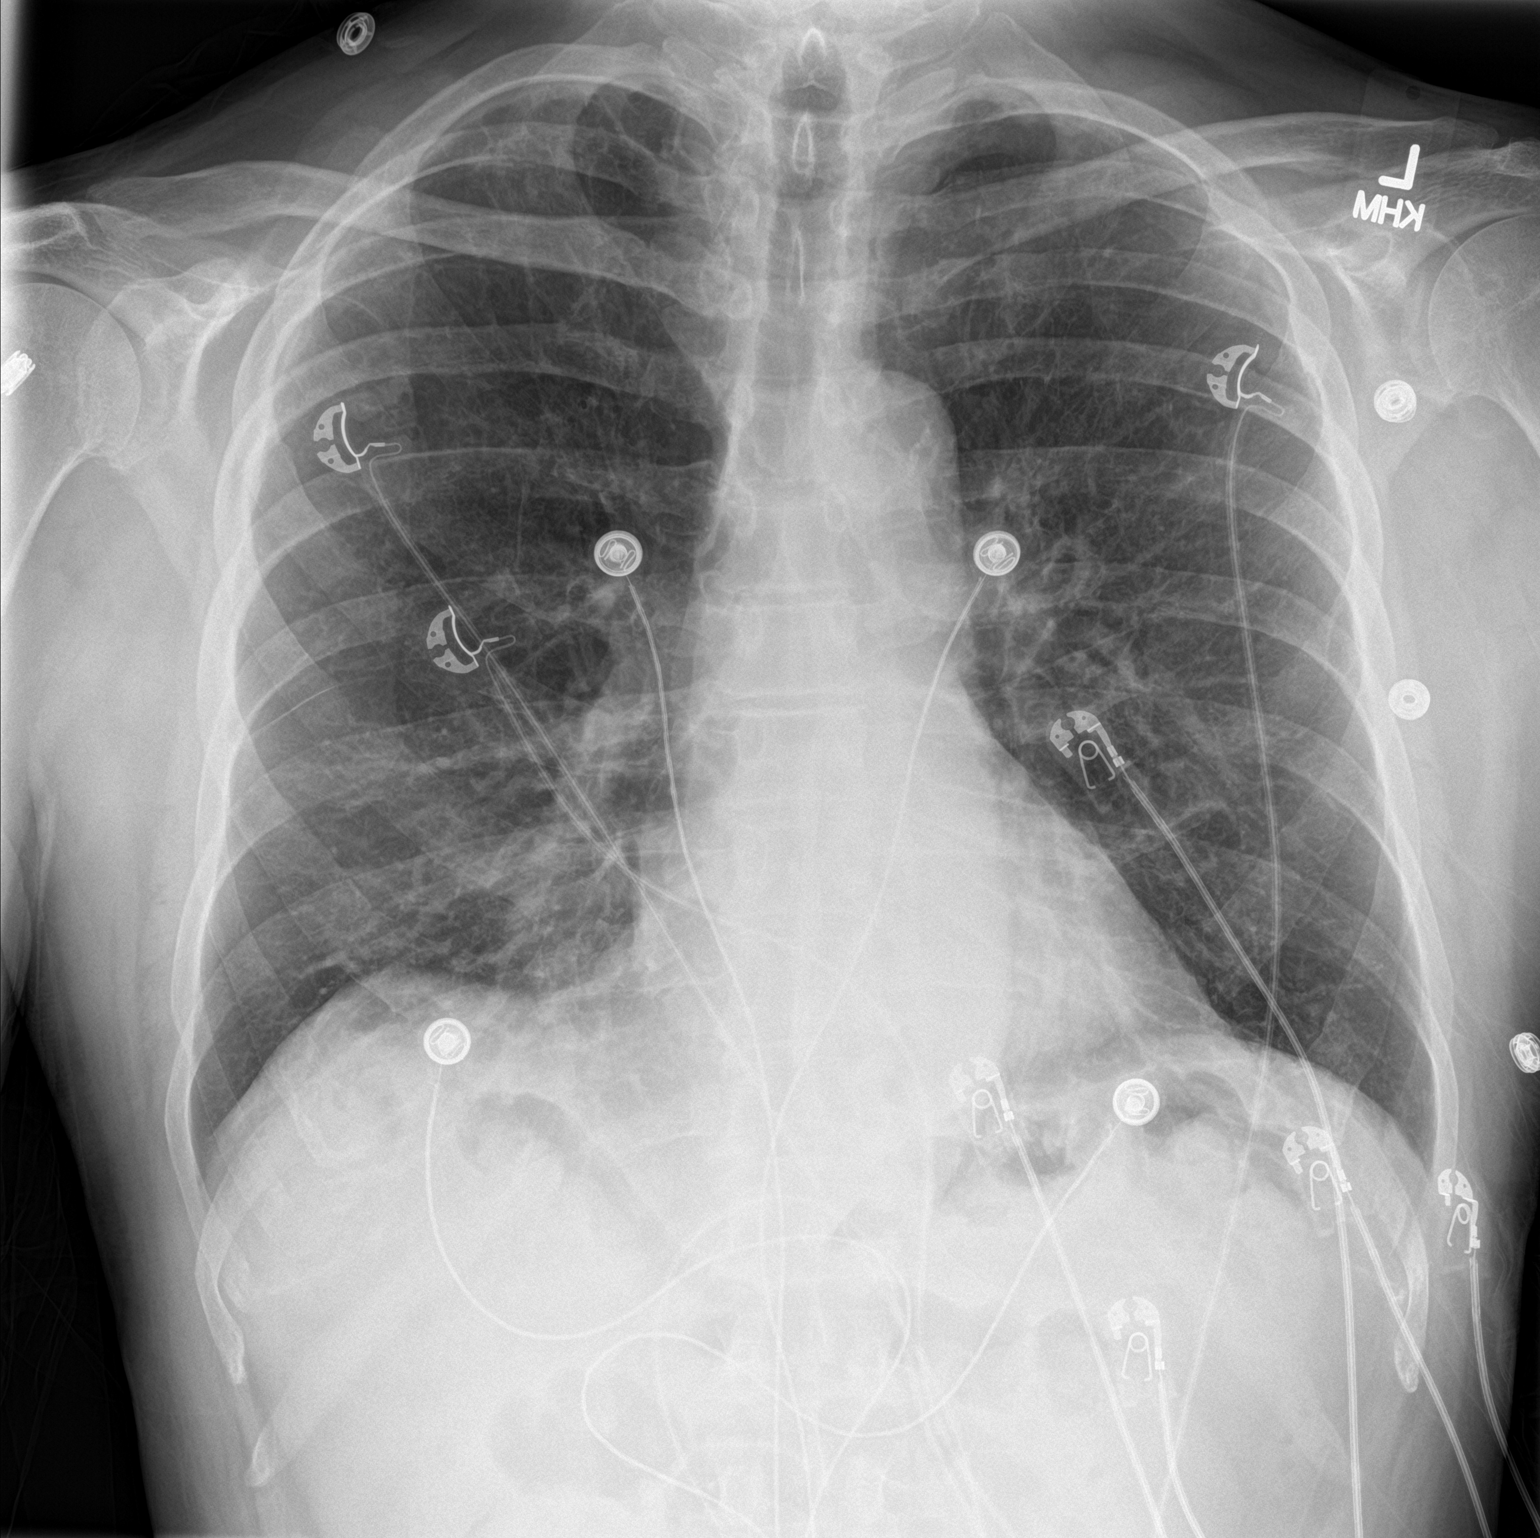

[chest lat]
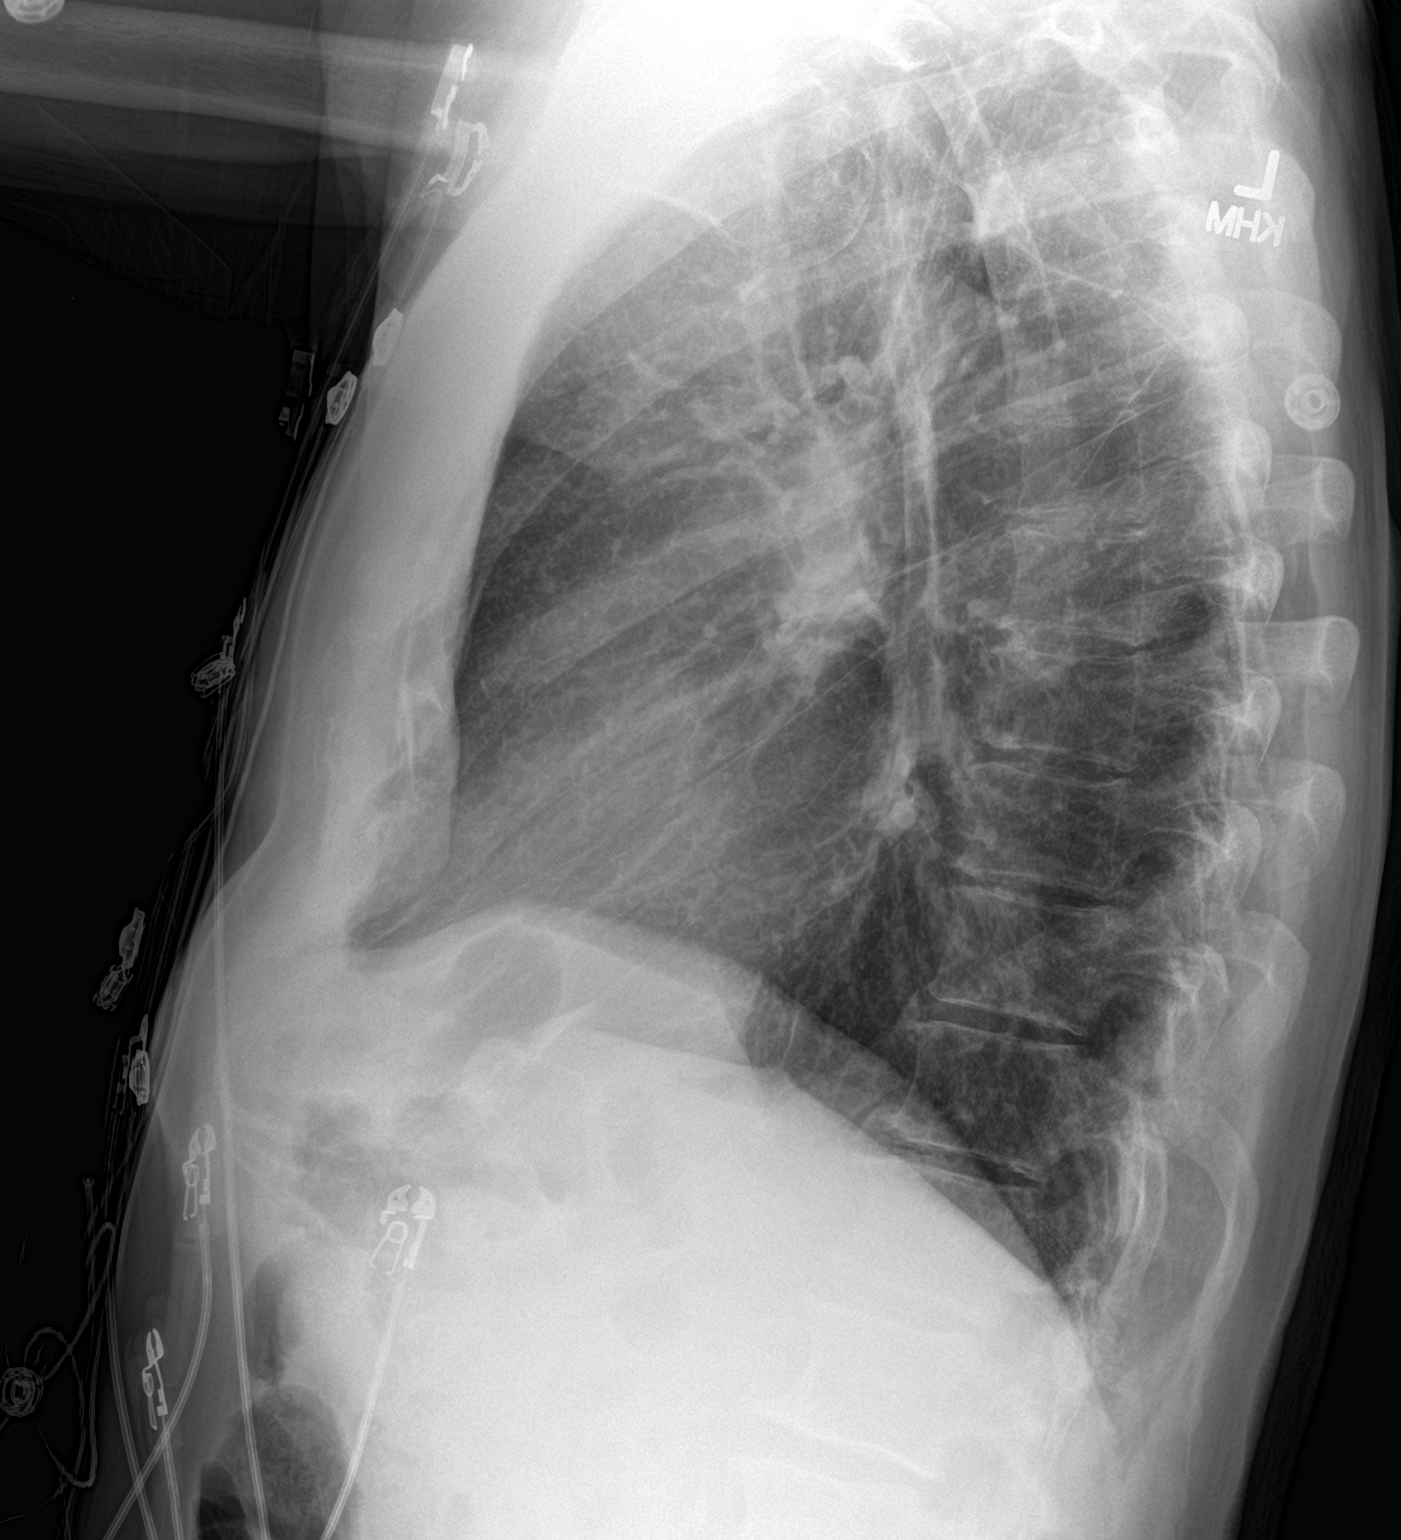

[2 of 2 positions shown; findings below may reference images not displayed]

FINDINGS: Heart size and mediastinal contours are normal. Mild atherosclerotic
changes noted at the aortic arch.

Mild scarring/fibrosis at each lung apex. Probable mild scarring/
fibrosis at the right lung base. No confluent opacity to suggest a
developing pneumonia. No pleural effusion or pneumothorax seen.

Mild degenerative spurring within the thoracic spine. No acute or
suspicious osseous finding.
IMPRESSION: 1. No active cardiopulmonary disease. No evidence of pneumonia or
pulmonary edema.
2. Aortic atherosclerosis.
3. Probable scarring/fibrosis at each lung apex and at the right
lung base.

## 2017-06-26 NOTE — Progress Notes (Signed)
Cardiology Office Note  Date: 06/29/2017   ID: Edward Young, DOB [birth year], MRN [id number]  PCP: Sheela Stack  Primary Cardiologist: [name], MD   Chief Complaint  Patient presents with  . PAF    History of Present Illness: Edward Young is a 67 y.o. male last seen in August 2018.  He presents for a routine follow-up visit.  States that he continues to have intermittent palpitations despite atrial fibrillation ablation.  He elected not to continue anticoagulation and is on aspirin at this time as well as Cardizem.  He reports no progressive symptoms his heart rate is regular today.  Interval follow-up with Dr. Johney Frame noted in October 2018, I reviewed the note.  He continues to smoke cigarettes, smoking cessation has been discussed but he has had difficulty quitting.  Past Medical History:  Diagnosis Date  . Arthritis   . GERD (gastroesophageal reflux disease)   . PAF (paroxysmal atrial fibrillation) (HCC)   . Pneumonia 2016    Past Surgical History:  Procedure Laterality Date  . ATRIAL FIBRILLATION ABLATION N/A 11/20/2016   Procedure: Atrial Fibrillation Ablation;  Surgeon: Hillis Range, MD;  Location[facility] INVASIVE CV LAB;  Service: Cardiovascular;  Laterality: N/A;  . FRACTURE SURGERY    . NASAL FRACTURE SURGERY  ~ 2003   "opened it up so I could breath; it had been broken ~ 3 times"    Current Outpatient Medications  Medication Sig Dispense Refill  . aspirin EC 81 MG tablet Take 1 tablet (81 mg total) by mouth daily. 90 tablet 3  . calcium carbonate (TUMS - DOSED IN MG ELEMENTAL CALCIUM) 500 MG chewable tablet Chew 1 tablet by mouth as needed for indigestion or heartburn.    . diltiazem (CARDIZEM) 120 MG tablet Take 120 mg by mouth 4 (four) times daily.    Marland Kitchen diltiazem (CARDIZEM) 30 MG tablet Take 1 tablet (30 mg total) by mouth as needed (palpitations). 30 tablet 3   No current facility-administered medications for this visit.      Allergies:  Patient has no known allergies.   Social History: The patient  reports that he has been smoking cigarettes.  He started smoking about 48 years ago. He has a 23.50 pack-year smoking history. he has never used smokeless tobacco. He reports that he does not drink alcohol or use drugs.   ROS:  Please see the history of present illness. Otherwise, complete review of systems is positive for none.  All other systems are reviewed and negative.   Physical Exam: VS:  BP 122/78   Pulse 84   Ht 5\' 8"  (1.727 m)   Wt 150 lb (68 kg)   SpO2 99%   BMI 22.81 kg/m , BMI Body mass index is 22.81 kg/m.  Wt Readings from Last 3 Encounters:  06/29/17 150 lb (68 kg)  02/23/17 145 lb 3.2 oz (65.9 kg)  12/16/16 149 lb 12.8 oz (67.9 kg)    General: Patient appears comfortable at rest. HEENT: Conjunctiva and lids normal, oropharynx clear with moist mucosa. Neck: Supple, no elevated JVP or carotid bruits, no thyromegaly. Lungs: No wheezing, nonlabored breathing at rest. Cardiac: Regular rate and rhythm, no S3 or significant systolic murmur, no pericardial rub. Abdomen: Soft, nontender, bowel sounds present, no guarding or rebound. Extremities: No pitting edema, distal pulses 2+.  ECG: I personally reviewed the tracing from 11/21/2016 which showed normal sinus rhythm.  Recent Labwork: 09/07/2016: BUN 5; Creatinine, Ser 0.93; Hemoglobin 18.7; Platelets  247; Potassium 3.5; Sodium 137; TSH 4.788   Other Studies Reviewed Today:  Echocardiogram 09/08/2016: Study Conclusions  - Left ventricle: The cavity size was normal. Wall thickness was   normal. Systolic function was normal. The estimated ejection   fraction was in the range of 60% to 65%. Wall motion was normal;   there were no regional wall motion abnormalities. The study was   not technically sufficient to allow evaluation of LV diastolic   dysfunction due to atrial fibrillation. - Mitral valve: Mildly calcified annulus.  Assessment  and Plan:  1.  Paroxysmal atrial fibrillation.  He is status post atrial fibrillation ablation with Dr. Johney FrameAllred.  Continues to report palpitations but does not report progressive symptoms or major interest in changing current treatment strategy.  He has declined anticoagulation, is taking aspirin now along with his Cardizem.  2.  Ongoing tobacco abuse.  He has not been motivated to quit.  Current medicines were reviewed with the patient today.  Disposition: Follow-up in 6 months.  Signed, Jonelle SidleSamuel G. McDowell, MD, [facility] CenterFACC 06/29/2017 4:47 PM    [facility] [place] [address], [place], [place] [address] Phone: [phone]; Fax: [phone]

## 2017-06-29 ENCOUNTER — Other Ambulatory Visit: Payer: Self-pay

## 2017-06-29 ENCOUNTER — Encounter: Payer: Self-pay | Admitting: Cardiology

## 2017-06-29 ENCOUNTER — Ambulatory Visit (INDEPENDENT_AMBULATORY_CARE_PROVIDER_SITE_OTHER): Payer: Medicare Other | Admitting: Cardiology

## 2017-06-29 VITALS — BP 122/78 | HR 84 | Ht 68.0 in | Wt 150.0 lb

## 2017-06-29 DIAGNOSIS — Z720 Tobacco use: Secondary | ICD-10-CM

## 2017-06-29 DIAGNOSIS — I480 Paroxysmal atrial fibrillation: Secondary | ICD-10-CM | POA: Diagnosis not present

## 2017-06-29 NOTE — Patient Instructions (Signed)

## 2017-07-01 ENCOUNTER — Ambulatory Visit: Payer: Medicare Other | Admitting: Internal Medicine

## 2017-10-03 DIAGNOSIS — L02224 Furuncle of groin: Secondary | ICD-10-CM | POA: Diagnosis not present

## 2017-10-03 DIAGNOSIS — L02214 Cutaneous abscess of groin: Secondary | ICD-10-CM | POA: Diagnosis not present

## 2017-11-16 DIAGNOSIS — Z720 Tobacco use: Secondary | ICD-10-CM | POA: Diagnosis not present

## 2017-11-16 DIAGNOSIS — Z6822 Body mass index (BMI) 22.0-22.9, adult: Secondary | ICD-10-CM | POA: Diagnosis not present

## 2017-11-16 DIAGNOSIS — F419 Anxiety disorder, unspecified: Secondary | ICD-10-CM | POA: Diagnosis not present

## 2017-11-16 DIAGNOSIS — M545 Low back pain: Secondary | ICD-10-CM | POA: Diagnosis not present

## 2017-11-25 DIAGNOSIS — Z6821 Body mass index (BMI) 21.0-21.9, adult: Secondary | ICD-10-CM | POA: Diagnosis not present

## 2017-11-25 DIAGNOSIS — M1611 Unilateral primary osteoarthritis, right hip: Secondary | ICD-10-CM | POA: Diagnosis not present

## 2017-11-25 DIAGNOSIS — M545 Low back pain: Secondary | ICD-10-CM | POA: Diagnosis not present

## 2017-11-25 DIAGNOSIS — M47816 Spondylosis without myelopathy or radiculopathy, lumbar region: Secondary | ICD-10-CM | POA: Diagnosis not present

## 2017-11-25 DIAGNOSIS — M5416 Radiculopathy, lumbar region: Secondary | ICD-10-CM | POA: Diagnosis not present

## 2017-11-25 DIAGNOSIS — F17210 Nicotine dependence, cigarettes, uncomplicated: Secondary | ICD-10-CM | POA: Diagnosis not present

## 2017-11-25 DIAGNOSIS — K5900 Constipation, unspecified: Secondary | ICD-10-CM | POA: Diagnosis not present

## 2017-11-26 ENCOUNTER — Other Ambulatory Visit: Payer: Self-pay

## 2017-12-04 DIAGNOSIS — Z1331 Encounter for screening for depression: Secondary | ICD-10-CM | POA: Diagnosis not present

## 2017-12-04 DIAGNOSIS — E782 Mixed hyperlipidemia: Secondary | ICD-10-CM | POA: Diagnosis not present

## 2017-12-04 DIAGNOSIS — R634 Abnormal weight loss: Secondary | ICD-10-CM | POA: Diagnosis not present

## 2017-12-04 DIAGNOSIS — Z1389 Encounter for screening for other disorder: Secondary | ICD-10-CM | POA: Diagnosis not present

## 2017-12-04 DIAGNOSIS — Z6822 Body mass index (BMI) 22.0-22.9, adult: Secondary | ICD-10-CM | POA: Diagnosis not present

## 2017-12-04 DIAGNOSIS — I480 Paroxysmal atrial fibrillation: Secondary | ICD-10-CM | POA: Diagnosis not present

## 2017-12-04 DIAGNOSIS — Z720 Tobacco use: Secondary | ICD-10-CM | POA: Diagnosis not present

## 2017-12-04 DIAGNOSIS — K219 Gastro-esophageal reflux disease without esophagitis: Secondary | ICD-10-CM | POA: Diagnosis not present

## 2017-12-04 DIAGNOSIS — M545 Low back pain: Secondary | ICD-10-CM | POA: Diagnosis not present

## 2017-12-04 DIAGNOSIS — F17210 Nicotine dependence, cigarettes, uncomplicated: Secondary | ICD-10-CM | POA: Diagnosis not present

## 2018-03-12 ENCOUNTER — Other Ambulatory Visit: Payer: Self-pay

## 2019-03-12 DIAGNOSIS — Z7982 Long term (current) use of aspirin: Secondary | ICD-10-CM | POA: Diagnosis not present

## 2019-03-12 DIAGNOSIS — G459 Transient cerebral ischemic attack, unspecified: Secondary | ICD-10-CM | POA: Diagnosis not present

## 2019-03-12 DIAGNOSIS — I480 Paroxysmal atrial fibrillation: Secondary | ICD-10-CM | POA: Diagnosis present

## 2019-03-12 DIAGNOSIS — I69351 Hemiplegia and hemiparesis following cerebral infarction affecting right dominant side: Secondary | ICD-10-CM | POA: Diagnosis not present

## 2019-03-12 DIAGNOSIS — F17200 Nicotine dependence, unspecified, uncomplicated: Secondary | ICD-10-CM | POA: Diagnosis not present

## 2019-03-12 DIAGNOSIS — G8321 Monoplegia of upper limb affecting right dominant side: Secondary | ICD-10-CM | POA: Diagnosis not present

## 2019-03-12 DIAGNOSIS — Z20828 Contact with and (suspected) exposure to other viral communicable diseases: Secondary | ICD-10-CM | POA: Diagnosis present

## 2019-03-12 DIAGNOSIS — I639 Cerebral infarction, unspecified: Secondary | ICD-10-CM | POA: Diagnosis not present

## 2019-03-12 DIAGNOSIS — I6521 Occlusion and stenosis of right carotid artery: Secondary | ICD-10-CM | POA: Diagnosis not present

## 2019-03-12 DIAGNOSIS — I4891 Unspecified atrial fibrillation: Secondary | ICD-10-CM | POA: Diagnosis not present

## 2019-03-12 DIAGNOSIS — M79601 Pain in right arm: Secondary | ICD-10-CM | POA: Diagnosis not present

## 2019-03-12 DIAGNOSIS — R519 Headache, unspecified: Secondary | ICD-10-CM | POA: Diagnosis not present

## 2019-03-15 DIAGNOSIS — I4891 Unspecified atrial fibrillation: Secondary | ICD-10-CM | POA: Diagnosis not present

## 2019-03-18 DIAGNOSIS — F17210 Nicotine dependence, cigarettes, uncomplicated: Secondary | ICD-10-CM | POA: Diagnosis not present

## 2019-03-18 DIAGNOSIS — Z6823 Body mass index (BMI) 23.0-23.9, adult: Secondary | ICD-10-CM | POA: Diagnosis not present

## 2019-03-18 DIAGNOSIS — I480 Paroxysmal atrial fibrillation: Secondary | ICD-10-CM | POA: Diagnosis not present

## 2019-03-18 DIAGNOSIS — I639 Cerebral infarction, unspecified: Secondary | ICD-10-CM | POA: Diagnosis not present

## 2019-03-23 ENCOUNTER — Other Ambulatory Visit: Payer: Self-pay

## 2019-03-29 DIAGNOSIS — I10 Essential (primary) hypertension: Secondary | ICD-10-CM | POA: Insufficient documentation

## 2019-03-29 DIAGNOSIS — Z8673 Personal history of transient ischemic attack (TIA), and cerebral infarction without residual deficits: Secondary | ICD-10-CM | POA: Insufficient documentation

## 2019-03-29 DIAGNOSIS — I480 Paroxysmal atrial fibrillation: Secondary | ICD-10-CM | POA: Diagnosis not present

## 2019-03-29 DIAGNOSIS — F17200 Nicotine dependence, unspecified, uncomplicated: Secondary | ICD-10-CM | POA: Diagnosis not present

## 2019-03-31 DIAGNOSIS — M25561 Pain in right knee: Secondary | ICD-10-CM | POA: Diagnosis not present

## 2019-03-31 DIAGNOSIS — M25461 Effusion, right knee: Secondary | ICD-10-CM | POA: Diagnosis not present

## 2019-03-31 DIAGNOSIS — Z6824 Body mass index (BMI) 24.0-24.9, adult: Secondary | ICD-10-CM | POA: Diagnosis not present

## 2019-04-01 ENCOUNTER — Emergency Department (HOSPITAL_COMMUNITY)
Admission: EM | Admit: 2019-04-01 | Discharge: 2019-04-01 | Disposition: A | Discharge status: Home / Self Care | Payer: Medicare Other | Attending: Emergency Medicine | Admitting: Emergency Medicine

## 2019-04-01 ENCOUNTER — Encounter (HOSPITAL_COMMUNITY): Payer: Self-pay | Admitting: Emergency Medicine

## 2019-04-01 ENCOUNTER — Other Ambulatory Visit: Payer: Self-pay

## 2019-04-01 ENCOUNTER — Emergency Department (HOSPITAL_COMMUNITY): Payer: Medicare Other

## 2019-04-01 DIAGNOSIS — M25461 Effusion, right knee: Secondary | ICD-10-CM | POA: Insufficient documentation

## 2019-04-01 DIAGNOSIS — Z7982 Long term (current) use of aspirin: Secondary | ICD-10-CM | POA: Diagnosis not present

## 2019-04-01 DIAGNOSIS — Z79899 Other long term (current) drug therapy: Secondary | ICD-10-CM | POA: Diagnosis not present

## 2019-04-01 DIAGNOSIS — F17210 Nicotine dependence, cigarettes, uncomplicated: Secondary | ICD-10-CM | POA: Diagnosis not present

## 2019-04-01 DIAGNOSIS — M25561 Pain in right knee: Secondary | ICD-10-CM | POA: Diagnosis present

## 2019-04-01 HISTORY — DX: Cerebral infarction, unspecified: I63.9

## 2019-04-01 LAB — CBC WITH DIFFERENTIAL/PLATELET
Abs Immature Granulocytes: 0.07 10*3/uL (ref 0.00–0.07)
Basophils Absolute: 0.1 10*3/uL (ref 0.0–0.1)
Basophils Relative: 1 %
Eosinophils Absolute: 0.1 10*3/uL (ref 0.0–0.5)
Eosinophils Relative: 1 %
HCT: 45 % (ref 39.0–52.0)
Hemoglobin: 14.9 g/dL (ref 13.0–17.0)
Immature Granulocytes: 1 %
Lymphocytes Relative: 15 %
Lymphs Abs: 1.6 10*3/uL (ref 0.7–4.0)
MCH: 33.5 pg (ref 26.0–34.0)
MCHC: 33.1 g/dL (ref 30.0–36.0)
MCV: 101.1 fL — ABNORMAL HIGH (ref 80.0–100.0)
Monocytes Absolute: 1.3 10*3/uL — ABNORMAL HIGH (ref 0.1–1.0)
Monocytes Relative: 12 %
Neutro Abs: 7.9 10*3/uL — ABNORMAL HIGH (ref 1.7–7.7)
Neutrophils Relative %: 70 %
Platelets: 312 10*3/uL (ref 150–400)
RBC: 4.45 MIL/uL (ref 4.22–5.81)
RDW: 14.3 % (ref 11.5–15.5)
WBC: 11 10*3/uL — ABNORMAL HIGH (ref 4.0–10.5)
nRBC: 0 % (ref 0.0–0.2)

## 2019-04-01 LAB — BASIC METABOLIC PANEL
Anion gap: 8 (ref 5–15)
BUN: 10 mg/dL (ref 8–23)
CO2: 26 mmol/L (ref 22–32)
Calcium: 8.5 mg/dL — ABNORMAL LOW (ref 8.9–10.3)
Chloride: 102 mmol/L (ref 98–111)
Creatinine, Ser: 1.14 mg/dL (ref 0.61–1.24)
GFR calc Af Amer: >60 mL/min (ref >60)
GFR calc non Af Amer: >60 mL/min (ref >60)
Glucose, Bld: 110 mg/dL — ABNORMAL HIGH (ref 70–99)
Potassium: 4 mmol/L (ref 3.5–5.1)
Sodium: 136 mmol/L (ref 135–145)

## 2019-04-01 LAB — URIC ACID: Uric Acid, Serum: 4.6 mg/dL (ref 3.7–8.6)

## 2019-04-01 NOTE — ED Triage Notes (Signed)
Pain and swelling to RT knee x 4-5 days.  States he has heard cracking and popping coming from his knee with movement,  Denies injury.

## 2019-04-01 NOTE — ED Provider Notes (Signed)
[facility] EMERGENCY DEPARTMENT Provider Note   CSN: [id number] Arrival date & time: 04/01/19  1755     History   Chief Complaint Chief Complaint  Patient presents with  . Knee Pain    HPI Edward Young is a 68 y.o. male.     The history is provided by the patient. No language interpreter was used.  Knee Pain Location:  Knee Injury: no   Knee location:  R knee Pain details:    Quality:  Aching   Radiates to:  Does not radiate   Severity:  Moderate   Onset quality:  Gradual   Timing:  Constant   Progression:  Worsening Chronicity:  New Dislocation: no   Foreign body present:  No foreign bodies Prior injury to area:  No Relieved by:  Nothing Worsened by:  Nothing Ineffective treatments:  None tried Associated symptoms: decreased ROM   Risk factors: no concern for non-accidental trauma    Pt complains of pain in his right knee.  Pt reports he has heard crunching.  Pt reports he had a similar episode years ago.  Past Medical History:  Diagnosis Date  . Arthritis   . GERD (gastroesophageal reflux disease)   . PAF (paroxysmal atrial fibrillation) (HCC)   . Pneumonia 2016  . Stroke [facility])     Patient Active Problem List   Diagnosis Date Noted  . Paroxysmal atrial fibrillation (HCC) 11/20/2016  . Atrial fibrillation with RVR (HCC) 09/07/2016  . Nicotine dependence 09/07/2016  . Aortic atherosclerosis (HCC) 09/07/2016  . Cigarette smoker 06/04/2007    Past Surgical History:  Procedure Laterality Date  . ATRIAL FIBRILLATION ABLATION N/A 11/20/2016   Procedure: Atrial Fibrillation Ablation;  Surgeon: Hillis Range, MD;  Location: [facility] INVASIVE CV LAB;  Service: Cardiovascular;  Laterality: N/A;  . FRACTURE SURGERY    . NASAL FRACTURE SURGERY  ~ 2003   "opened it up so I could breath; it had been broken ~ 3 times"        Home Medications    Prior to Admission medications   Medication Sig Start Date End Date Taking? Authorizing Provider  aspirin EC  81 MG tablet Take 1 tablet (81 mg total) by mouth daily. 12/16/16   Jonelle Sidle, MD  calcium carbonate (TUMS - DOSED IN MG ELEMENTAL CALCIUM) 500 MG chewable tablet Chew 1 tablet by mouth as needed for indigestion or heartburn.    [provider]  diltiazem (CARDIZEM) 120 MG tablet Take 120 mg by mouth 4 (four) times daily.    [provider]  diltiazem (CARDIZEM) 30 MG tablet Take 1 tablet (30 mg total) by mouth as needed (palpitations). 10/31/16   Jonelle Sidle, MD    Family History Family History  Problem Relation Age of Onset  . Alzheimer's disease Mother   . Hypertension Father   . Parkinson's disease Brother   . Kidney cancer Maternal Grandfather   . Pneumonia Paternal Grandmother     Social History Social History   Tobacco Use  . Smoking status: Current Every Day Smoker    Packs/day: 0.50    Years: 47.00    Pack years: 23.50    Types: Cigarettes    Start date: 02/24/1969  . Smokeless tobacco: Never Used  Substance Use Topics  . Alcohol use: No  . Drug use: No     Allergies   Patient has no known allergies.   Review of Systems Review of Systems  All other systems reviewed and are  negative.    Physical Exam Updated Vital Signs BP 108/89 (BP Location: Right Arm)   Pulse 82   Temp 98.8 F (37.1 C) (Oral)   Resp 17   Ht 5\' 8"  (1.727 m)   Wt 71.2 kg   SpO2 98%   BMI 23.87 kg/m   Physical Exam Vitals signs and nursing note reviewed.  Constitutional:      Appearance: He is well-developed.  HENT:     Head: Normocephalic and atraumatic.  Eyes:     Conjunctiva/sclera: Conjunctivae normal.  Cardiovascular:     Rate and Rhythm: Normal rate.     Heart sounds: No murmur.  Pulmonary:     Effort: Pulmonary effort is normal. No respiratory distress.  Abdominal:     Tenderness: There is no abdominal tenderness.  Musculoskeletal:        General: Swelling and tenderness present.     Comments: Swollen right knee,  Normal range of  motion nv and ns intact   Skin:    General: Skin is warm and dry.  Neurological:     General: No focal deficit present.     Mental Status: He is alert.  Psychiatric:        Mood and Affect: Mood normal.      ED Treatments / Results  Labs (all labs ordered are listed, but only abnormal results are displayed) Labs Reviewed  CBC WITH DIFFERENTIAL/PLATELET - Abnormal; Notable for the following components:      Result Value   WBC 11.0 (*)    MCV 101.1 (*)    Neutro Abs 7.9 (*)    Monocytes Absolute 1.3 (*)    All other components within normal limits  BASIC METABOLIC PANEL - Abnormal; Notable for the following components:   Glucose, Bld 110 (*)    Calcium 8.5 (*)    All other components within normal limits  URIC ACID    EKG None  Radiology Dg Knee Complete 4 Views Right  Result Date: 04/01/2019 CLINICAL DATA:  Knee swelling EXAM: RIGHT KNEE - COMPLETE 4+ VIEW COMPARISON:  03/31/2019 FINDINGS: No fracture or malalignment. Moderate knee effusion. Mild patellofemoral and medial joint space degenerative change. IMPRESSION: No acute osseous abnormality.  Moderate knee effusion Electronically Signed   By: Donavan Foil M.D.   On: 04/01/2019 19:35    Procedures Procedures (including critical care time)  Medications Ordered in ED Medications - No data to display   Initial Impression / Assessment and Plan / ED Course  I have reviewed the triage vital signs and the nursing notes.  Pertinent labs & imaging results that were available during my care of the patient were reviewed by me and considered in my medical decision making (see chart for details).        MDM  Xray reviewed and discussed with pt.  Uric acid is normal.  Pt placed in a knee imbolizer.  Pt advised to follow up with Dr. Aline Brochure for recheck next week,   Final Clinical Impressions(s) / ED Diagnoses   Final diagnoses:  Effusion of right knee    ED Discharge Orders    None    An After Visit Summary was  printed and given to the patient.    Sidney Ace 04/01/19 2013    Maudie Flakes, MD 04/02/19 1102

## 2019-04-01 NOTE — Discharge Instructions (Addendum)
Return if any problems. Schedule to see  the Orthopaedist for recheck.  

## 2019-04-04 ENCOUNTER — Telehealth: Payer: Self-pay | Admitting: Radiology

## 2019-04-04 ENCOUNTER — Ambulatory Visit (INDEPENDENT_AMBULATORY_CARE_PROVIDER_SITE_OTHER): Payer: Medicare Other | Admitting: Orthopedic Surgery

## 2019-04-04 ENCOUNTER — Other Ambulatory Visit: Payer: Self-pay

## 2019-04-04 VITALS — BP 116/72 | HR 60 | Temp 97.4°F | Ht 68.0 in

## 2019-04-04 DIAGNOSIS — M25461 Effusion, right knee: Secondary | ICD-10-CM

## 2019-04-04 DIAGNOSIS — M25561 Pain in right knee: Secondary | ICD-10-CM | POA: Diagnosis not present

## 2019-04-04 NOTE — Telephone Encounter (Signed)
I called for lab pick up routine the confirmation number is [id number]

## 2019-04-04 NOTE — Progress Notes (Signed)
Edward Young [facility]  04/04/2019  Body mass index is 23.87 kg/m.   HISTORY SECTION :  Chief Complaint  Patient presents with  . Knee Pain    ER follow up on right knee.   HPI The patient presents for evaluation of  (mild/moderate/severe/ ) severe pain in his right knee for about 8 days associated with pain swelling loss of motion denies trauma  He is on Eliquis recently for mini stroke  Review of Systems  Constitutional: Negative for chills and fever.  Skin: Negative.  Negative for rash.     has a past medical history of Arthritis, GERD (gastroesophageal reflux disease), PAF (paroxysmal atrial fibrillation) (HCC), Pneumonia (2016), and Stroke (HCC).   Past Surgical History:  Procedure Laterality Date  . ATRIAL FIBRILLATION ABLATION N/A 11/20/2016   Procedure: Atrial Fibrillation Ablation;  Surgeon: Hillis Range, MD;  Location: [facility] INVASIVE CV LAB;  Service: Cardiovascular;  Laterality: N/A;  . FRACTURE SURGERY    . NASAL FRACTURE SURGERY  ~ 2003   "opened it up so I could breath; it had been broken ~ 3 times"    Body mass index is 23.87 kg/m.   No Known Allergies   Current Outpatient Medications:  .  aspirin EC 81 MG tablet, Take 1 tablet (81 mg total) by mouth daily., Disp: 90 tablet, Rfl: 3 .  calcium carbonate (TUMS - DOSED IN MG ELEMENTAL CALCIUM) 500 MG chewable tablet, Chew 1 tablet by mouth as needed for indigestion or heartburn., Disp: , Rfl:  .  diltiazem (CARDIZEM) 120 MG tablet, Take 120 mg by mouth 4 (four) times daily., Disp: , Rfl:  .  diltiazem (CARDIZEM) 30 MG tablet, Take 1 tablet (30 mg total) by mouth as needed (palpitations)., Disp: 30 tablet, Rfl: 3   PHYSICAL EXAM SECTION: 1) BP 116/72   Pulse 60   Temp (!) 97.4 F (36.3 C)   Ht 5\' 8"  (1.727 m)   BMI 23.87 kg/m   Body mass index is 23.87 kg/m. General appearance: Well-developed well-nourished no gross deformities  2) Cardiovascular normal pulse and perfusion in the lower  extremities  normal color without edema  3) Neurologically deep tendon reflexes are equal and normal, no sensation loss or deficits no pathologic reflexes  4) Psychological: Awake alert and oriented x3 mood and affect normal  5) Skin no lacerations or ulcerations no nodularity no palpable masses, no erythema or nodularity  6) Musculoskeletal: Right knee large joint effusion this limits his range of motion his knee feels stable from what we can assess if diffuse tenderness is noted around the knee joint especially in the suprapatellar region there is no malalignment   MEDICAL DECISION SECTION:  Encounter Diagnosis  Name Primary?  . Effusion, right knee Yes    Imaging 4 view right knee no fracture or dislocation but large joint effusion  Plan:  (Rx., Inj., surg., Frx, MRI/CT, XR:2)  Aspiration injection right knee  Procedure note injection and aspiration right knee joint  Verbal consent was obtained to aspirate and inject the right knee joint   Timeout was completed to confirm the site of aspiration and injection  An 18-gauge needle was used to aspirate the knee joint from a suprapatellar lateral approach.  The medications used were 40 mg of Depo-Medrol and 1% lidocaine 3 cc  Anesthesia was provided by ethyl chloride and the skin was prepped with alcohol.  After cleaning the skin with alcohol an 18-gauge needle was used to aspirate the right knee joint.  We  obtained 45  cc of fluid cloudy yellow   We follow this by injection of 40 mg of Depo-Medrol and 3 cc 1% lidocaine.  There were no complications. A sterile bandage was applied.  Fluid analysis sent  Review and call patient with results and further treatment in the meantime we will keep a knee sleeve on ice the knee 4 times a day  2:00 PM Arther Abbott, MD  04/04/2019

## 2019-04-04 NOTE — Patient Instructions (Addendum)
Ice 4 x a day for 30 minutes   Wear compression sleeve until u see the doctor again    Dr Aline Brochure will call u Monday

## 2019-04-07 DIAGNOSIS — Z6823 Body mass index (BMI) 23.0-23.9, adult: Secondary | ICD-10-CM | POA: Diagnosis not present

## 2019-04-07 DIAGNOSIS — F17210 Nicotine dependence, cigarettes, uncomplicated: Secondary | ICD-10-CM | POA: Diagnosis not present

## 2019-04-07 DIAGNOSIS — I639 Cerebral infarction, unspecified: Secondary | ICD-10-CM | POA: Diagnosis not present

## 2019-04-07 DIAGNOSIS — I480 Paroxysmal atrial fibrillation: Secondary | ICD-10-CM | POA: Diagnosis not present

## 2019-04-07 LAB — SYNOVIAL CELL COUNT + DIFF, W/ CRYSTALS
Basophils, %: 0 %
Eosinophils-Synovial: 0 % (ref 0–2)
Lymphocytes-Synovial Fld: 13 % (ref 0–74)
Monocyte/Macrophage: 4 % (ref 0–69)
Neutrophil, Synovial: 83 % — ABNORMAL HIGH (ref 0–24)
Synoviocytes, %: 0 % (ref 0–15)
WBC, Synovial: 60340 cells/uL — ABNORMAL HIGH (ref <150)

## 2019-04-07 LAB — WOUND CULTURE
MICRO NUMBER:: 1170350
RESULT:: NO GROWTH
SPECIMEN QUALITY:: ADEQUATE

## 2019-04-12 ENCOUNTER — Telehealth: Payer: Self-pay | Admitting: Orthopedic Surgery

## 2019-04-12 NOTE — Telephone Encounter (Signed)
Mr. Weatherall results were given to him he wants to manage with Tylenol  He is on Eliquis which precludes any anti-inflammatories  He does have the option of further cortisone injections or Ultracet for pain

## 2019-04-19 ENCOUNTER — Telehealth: Payer: Self-pay | Admitting: Orthopedic Surgery

## 2019-04-19 NOTE — Telephone Encounter (Signed)
If schedule is full, which you state it is, then he will have to wait until after Christmas, right?

## 2019-04-19 NOTE — Telephone Encounter (Signed)
Patient called to inquire about returning for another visit with Dr Aline Brochure- before Christmas, for his right knee, which he said is swelling up again. States he's been icing it. We've relayed that Dr Aline Brochure is in surgery today, and that schedule shows full for tomorrow, 04/20/19.  Please advise.

## 2019-04-19 NOTE — Telephone Encounter (Signed)
Called back to patient per response. Edward Young will continue to ice and elevate. Voiced understanding.

## 2019-04-20 NOTE — Telephone Encounter (Signed)
As of 04/19/19, I called patient and scheduled appointment, next available. Aware also on wait list in event of cancellation for sooner date.

## 2019-05-02 ENCOUNTER — Encounter: Payer: Self-pay | Admitting: Orthopedic Surgery

## 2019-05-02 ENCOUNTER — Ambulatory Visit (INDEPENDENT_AMBULATORY_CARE_PROVIDER_SITE_OTHER): Payer: Medicare Other | Admitting: Orthopedic Surgery

## 2019-05-02 ENCOUNTER — Encounter

## 2019-05-02 ENCOUNTER — Other Ambulatory Visit: Payer: Self-pay

## 2019-05-02 VITALS — BP 109/75 | HR 55 | Temp 97.3°F | Ht 68.0 in

## 2019-05-02 DIAGNOSIS — M25461 Effusion, right knee: Secondary | ICD-10-CM

## 2019-05-02 NOTE — Patient Instructions (Signed)
Ice the knee 3 x a day   Wear that sleeve  Take the tylenol for pain   Come back in 4 weeks

## 2019-05-02 NOTE — Progress Notes (Signed)
Chief Complaint  Patient presents with  . Follow-up    Recheck on right knee.    69 year old male had a stroke in November he is now on a blood thinner Eliquis.  He presented on December 7 with severe pain in the right knee for about 8 days we aspirated the knee and obtained 45 cc of yellow fluid with no blood  Fluid analysis came back as inflammatory fluid  He presents back with increasing pain and worsening swelling in the right knee again  He is taking Tylenol for pain is using a cane for ambulation and a knee sleeve  His stroke was brought on by atrial fibrillation he also has hypertension and he is a 1-1/2 pack per 2-day smoker  Review of systems no fever no rash over the knee  Past Medical History:  Diagnosis Date  . Arthritis   . GERD (gastroesophageal reflux disease)   . PAF (paroxysmal atrial fibrillation) (HCC)   . Pneumonia 2016  . Stroke [facility])      Current Outpatient Medications:  .  calcium carbonate (TUMS - DOSED IN MG ELEMENTAL CALCIUM) 500 MG chewable tablet, Chew 1 tablet by mouth as needed for indigestion or heartburn., Disp: , Rfl:  .  diltiazem (CARDIZEM) 120 MG tablet, Take 120 mg by mouth 4 (four) times daily., Disp: , Rfl:  .  diltiazem (CARDIZEM) 30 MG tablet, Take 1 tablet (30 mg total) by mouth as needed (palpitations)., Disp: 30 tablet, Rfl: 3 .  LORazepam (ATIVAN) 0.5 MG tablet, Take 0.5 mg by mouth daily., Disp: , Rfl:  .  metoprolol tartrate (LOPRESSOR) 25 MG tablet, Take by mouth., Disp: , Rfl:  .  apixaban (ELIQUIS) 5 MG TABS tablet, Take by mouth., Disp: , Rfl:   Exam shows swelling of the right knee tense joint effusion which causes a decrease in the range of motion is tender to palpation around the knee the skin is clean there is no erythema he has tenderness on the medial joint line which she says is worse when he is walking   Medical decision making   Encounter Diagnosis  Name Primary?  . Effusion, right knee Yes    Chronic illness  with exacerbation, knee effusion with recurrence Treatment goal is pain tolerable with activities of daily living and no effusions  Recommend knee joint aspiration minor procedure with risk of bleeding from Eliquis  Procedure note injection and aspiration right knee joint  Verbal consent was obtained to aspirate and inject the right knee joint   Timeout was completed to confirm the site of aspiration and injection  An 18-gauge needle was used to aspirate the knee joint from a suprapatellar lateral approach.  The medications used were 40 mg of Depo-Medrol and 1% lidocaine 3 cc  Anesthesia was provided by ethyl chloride and the skin was prepped with alcohol.  After cleaning the skin with alcohol an 18-gauge needle was used to aspirate the right knee joint.  We obtained 30  cc of fluid initial 20 cc was clear last 10 cc was slightly blood-tinged  We follow this by injection of 40 mg of Depo-Medrol and 3 cc 1% lidocaine.  There were no complications. A sterile bandage was applied.    Recommend ice Cane Tylenol for pain Return 4 weeks

## 2019-05-06 ENCOUNTER — Ambulatory Visit: Payer: Medicare Other | Admitting: Orthopedic Surgery

## 2019-06-01 ENCOUNTER — Ambulatory Visit: Payer: Medicare Other | Admitting: Orthopedic Surgery

## 2019-06-29 ENCOUNTER — Ambulatory Visit: Payer: Medicare Other | Admitting: Orthopedic Surgery

## 2019-09-29 DIAGNOSIS — R001 Bradycardia, unspecified: Secondary | ICD-10-CM | POA: Diagnosis not present

## 2019-09-29 DIAGNOSIS — I480 Paroxysmal atrial fibrillation: Secondary | ICD-10-CM | POA: Diagnosis not present

## 2019-09-29 DIAGNOSIS — R002 Palpitations: Secondary | ICD-10-CM | POA: Diagnosis not present

## 2019-09-29 DIAGNOSIS — Z8673 Personal history of transient ischemic attack (TIA), and cerebral infarction without residual deficits: Secondary | ICD-10-CM | POA: Diagnosis not present

## 2020-02-06 DIAGNOSIS — F17200 Nicotine dependence, unspecified, uncomplicated: Secondary | ICD-10-CM | POA: Diagnosis not present

## 2020-02-06 DIAGNOSIS — I480 Paroxysmal atrial fibrillation: Secondary | ICD-10-CM | POA: Diagnosis not present

## 2020-02-06 DIAGNOSIS — I10 Essential (primary) hypertension: Secondary | ICD-10-CM | POA: Diagnosis not present

## 2020-02-06 DIAGNOSIS — Z8673 Personal history of transient ischemic attack (TIA), and cerebral infarction without residual deficits: Secondary | ICD-10-CM | POA: Diagnosis not present

## 2020-02-13 DIAGNOSIS — I639 Cerebral infarction, unspecified: Secondary | ICD-10-CM | POA: Diagnosis not present

## 2020-02-13 DIAGNOSIS — F17210 Nicotine dependence, cigarettes, uncomplicated: Secondary | ICD-10-CM | POA: Diagnosis not present

## 2020-02-13 DIAGNOSIS — I480 Paroxysmal atrial fibrillation: Secondary | ICD-10-CM | POA: Diagnosis not present

## 2020-02-13 DIAGNOSIS — Z6823 Body mass index (BMI) 23.0-23.9, adult: Secondary | ICD-10-CM | POA: Diagnosis not present

## 2020-02-14 DIAGNOSIS — I4891 Unspecified atrial fibrillation: Secondary | ICD-10-CM | POA: Diagnosis not present

## 2020-02-14 DIAGNOSIS — I10 Essential (primary) hypertension: Secondary | ICD-10-CM | POA: Diagnosis not present

## 2020-02-14 DIAGNOSIS — I480 Paroxysmal atrial fibrillation: Secondary | ICD-10-CM | POA: Diagnosis not present

## 2020-02-17 DIAGNOSIS — I480 Paroxysmal atrial fibrillation: Secondary | ICD-10-CM | POA: Diagnosis not present

## 2020-02-22 DIAGNOSIS — I480 Paroxysmal atrial fibrillation: Secondary | ICD-10-CM | POA: Diagnosis not present

## 2020-02-29 DIAGNOSIS — I480 Paroxysmal atrial fibrillation: Secondary | ICD-10-CM | POA: Diagnosis not present

## 2020-03-14 DIAGNOSIS — I480 Paroxysmal atrial fibrillation: Secondary | ICD-10-CM | POA: Diagnosis not present

## 2020-03-28 DIAGNOSIS — I480 Paroxysmal atrial fibrillation: Secondary | ICD-10-CM | POA: Diagnosis not present

## 2020-10-09 ENCOUNTER — Ambulatory Visit (INDEPENDENT_AMBULATORY_CARE_PROVIDER_SITE_OTHER): Payer: Medicare Other | Admitting: Orthopaedic Surgery

## 2020-10-09 ENCOUNTER — Other Ambulatory Visit: Payer: Self-pay

## 2020-10-09 ENCOUNTER — Ambulatory Visit: Payer: Medicare Other

## 2020-10-09 ENCOUNTER — Encounter: Payer: Self-pay | Admitting: Orthopaedic Surgery

## 2020-10-09 VITALS — BP 113/74 | HR 66 | Ht 68.0 in | Wt 157.0 lb

## 2020-10-09 DIAGNOSIS — M25572 Pain in left ankle and joints of left foot: Secondary | ICD-10-CM

## 2020-10-09 DIAGNOSIS — M25562 Pain in left knee: Secondary | ICD-10-CM

## 2020-10-09 NOTE — Progress Notes (Signed)
My knee hurts and is swollen and I have swelling in my left foot and ankle.  He noticed pain in the left knee and left foot and ankle about two weeks ago.  He had no trauma. He was seen at [facility] on 10-08-20.  He was evaluated, had Doppler study which was negative, had labs, had chest x-rays.  I have reviewed the notes from [facility].  Patient is anticoagulated.  He has atrial fib.  He continues to have swelling and pain, more pain in the left knee.  Exam shows swelling of the left foot and ankle, slight of the left calf and lower leg, swelling of the left knee. He has crepitus of the left knee and moderate effusion. ROM of the left knee is 0 to 110 with some pain.  He is in a wheelchair.    X-rays were done of the left knee, left ankle and left foot.  Encounter Diagnoses  Name Primary?   Acute pain of left knee    Pain in left ankle and joints of left foot Yes   PROCEDURE NOTE:  The patient request injection, verbal consent was obtained.  The left knee was prepped appropriately after time out was performed.   Sterile technique was observed and anesthesia was provided by ethyl chloride and a 20-gauge needle was used to inject the knee area.  A 16-gauge needle was then used to aspirate the knee.  Color of fluid aspirated was straw  Total cc's aspirated was 20.    Injection of 1 cc of DepoMedrol 40 with several cc's of plain xylocaine was then performed.  A band aid dressing was applied.  The patient was advised to apply ice later today and tomorrow to the injection sight as needed.   Return in one month.  Call if any problem.  Precautions discussed.  Electronically Signed Darreld Mclean, MD 6/14/202210:21 AM

## 2020-11-06 ENCOUNTER — Ambulatory Visit: Payer: Medicare Other | Admitting: Orthopaedic Surgery

## 2020-11-21 ENCOUNTER — Ambulatory Visit (INDEPENDENT_AMBULATORY_CARE_PROVIDER_SITE_OTHER): Payer: Medicare Other | Admitting: Orthopaedic Surgery

## 2020-11-21 ENCOUNTER — Other Ambulatory Visit: Payer: Self-pay

## 2020-11-21 ENCOUNTER — Encounter: Payer: Self-pay | Admitting: Orthopaedic Surgery

## 2020-11-21 DIAGNOSIS — G8929 Other chronic pain: Secondary | ICD-10-CM

## 2020-11-21 DIAGNOSIS — M25561 Pain in right knee: Secondary | ICD-10-CM | POA: Diagnosis not present

## 2020-11-21 DIAGNOSIS — M25562 Pain in left knee: Secondary | ICD-10-CM | POA: Diagnosis not present

## 2020-11-21 MED ORDER — BUPIVACAINE HCL 0.25 % IJ SOLN
2.0000 mL | INTRAMUSCULAR | Status: AC | PRN
  Administered 2020-11-21: 2 mL via INTRA_ARTICULAR

## 2020-11-21 NOTE — Progress Notes (Signed)
Office Visit Note   Patient: Edward Young           Date of Birth: [birth year]           MRN: [id number] Visit Date: 11/21/2020              Requested by: Royann Shivers, PA-C [id number] [place],  [place] [address] PCP: Royann Shivers, PA-C   Assessment & Plan: Visit Diagnoses:  1. Chronic pain of both knees     Plan: Mr. Fandino was seen by Dr. Hilda Lias about 6 weeks ago for evaluation of left knee pain and swelling.  His knee was aspirated with cortisone.  He notes that it did make a difference but it has recurred to the point where he is having difficulty bearing weight.  He has had some issues with his right knee in the past but presently is asymptomatic..  Today the left knee is effused and mostly tender along the medial compartment.  I aspirated the knee of 60 cc of somewhat straw-colored fluid and will send to the lab.  I injected cortisone he felt much better.  I suspect his problem is related to osteoarthritis but he is on warfarin with a history of A. fib.  I did not see any abnormality with his x-rays other than some narrowing of the medial joint space.  No ectopic calcification  Follow-Up Instructions: Return if symptoms worsen or fail to improve.   Orders:  No orders of the defined types were placed in this encounter.  No orders of the defined types were placed in this encounter.     Procedures: Large Joint Inj: L knee on 11/21/2020 11:34 AM Indications: pain and diagnostic evaluation Details: 25 G 1.5 in needle, anteromedial approach  Arthrogram: No  Medications: 2 mL bupivacaine 0.25 % Aspirate: 60 mL yellow and cloudy; sent for lab analysis  12 mg betamethasone injected with Marcaine into left knee after aspiration Procedure, treatment alternatives, risks and benefits explained, specific risks discussed. Consent was given by the patient. Patient was prepped and draped in the usual sterile fashion.      Clinical Data: No additional  findings.   Subjective: Chief Complaint  Patient presents with   Right Knee - Pain   Left Knee - Pain  Patient presents today for bilateral knee pain. Left is worse than the right. Patient states that he has been hurting for a couple months. Left knee hurts medially, and the right hurts posteriorly. He takes tylenol for pain relief.  He has x-rays taken in June on the PACS system.  Seen by Dr. Hilda Lias about 6 weeks ago for evaluation of similar issue with his left knee.  Knee was aspirated and injected with cortisone.  I do not see any evidence of the fluid was sent to the lab.  Mr. Helt does have a history of atrial fibrillation and is on warfarin.  No recent injury or trauma.  I did review x-rays from his prior office visit with some narrowing of the medial joint space but no significant subchondral sclerosis or evidence of ectopic calcification  HPI  Review of Systems   Objective: Vital Signs: Ht 5\' 7"  (1.702 m)   Wt 156 lb (70.8 kg)   BMI 24.43 kg/m   Physical Exam Constitutional:      Appearance: He is well-developed.  Pulmonary:     Effort: Pulmonary effort is normal.  Skin:    General: Skin is warm and dry.  Neurological:  Mental Status: He is alert and oriented to person, place, and time.  Psychiatric:        Behavior: Behavior normal.    Ortho Exam awake alert and oriented x3.  Comfortable sitting.  Does have some discomfort along the medial compartment of his left knee with a large effusion.  The knee was slightly warm.  Lacks a few degrees to full extension based on his effusion and only flexes about 90 degrees.  No instability.  No popliteal pain.  No catching  Specialty Comments:  No specialty comments available.  Imaging: No results found.   PMFS History: Patient Active Problem List   Diagnosis Date Noted   Knee pain, bilateral 11/21/2020   Essential (primary) hypertension 03/29/2019   Recent cerebrovascular accident (CVA) 03/29/2019   Paroxysmal  atrial fibrillation (HCC) 11/20/2016   Atrial fibrillation with RVR (HCC) 09/07/2016   Nicotine dependence 09/07/2016   Aortic atherosclerosis (HCC) 09/07/2016   Esophageal reflux 12/31/2015   Pain in joint, shoulder region 12/31/2015   Other malaise and fatigue 12/27/2015   Pneumonia due to infectious organism 09/26/2014   Wheezing 08/21/2014   Cigarette smoker 06/04/2007   Palpitations 06/04/2007   Personal history of other diseases of circulatory system 06/04/2007   Past Medical History:  Diagnosis Date   Arthritis    GERD (gastroesophageal reflux disease)    PAF (paroxysmal atrial fibrillation) (HCC)    Pneumonia 2016   Stroke (HCC)     Family History  Problem Relation Age of Onset   Alzheimer's disease Mother    Hypertension Father    Parkinson's disease Brother    Kidney cancer Maternal Grandfather    Pneumonia Paternal Grandmother     Past Surgical History:  Procedure Laterality Date   ATRIAL FIBRILLATION ABLATION N/A 11/20/2016   Procedure: Atrial Fibrillation Ablation;  Surgeon: Hillis Range, MD;  Location: [facility];  Service: Cardiovascular;  Laterality: N/A;   FRACTURE SURGERY     NASAL FRACTURE SURGERY  ~ 2003   "opened it up so I could breath; it had been broken ~ 3 times"   Social History   Occupational History   Not on file  Tobacco Use   Smoking status: Every Day    Packs/day: 0.50    Years: 47.00    Pack years: 23.50    Types: Cigarettes    Start date: 02/24/1969   Smokeless tobacco: Never  Vaping Use   Vaping Use: Never used  Substance and Sexual Activity   Alcohol use: No   Drug use: No   Sexual activity: Not on file

## 2020-12-05 ENCOUNTER — Other Ambulatory Visit: Payer: Self-pay

## 2020-12-05 ENCOUNTER — Encounter: Payer: Self-pay | Admitting: Orthopaedic Surgery

## 2020-12-05 ENCOUNTER — Ambulatory Visit (INDEPENDENT_AMBULATORY_CARE_PROVIDER_SITE_OTHER): Payer: Medicare Other | Admitting: Orthopaedic Surgery

## 2020-12-05 ENCOUNTER — Ambulatory Visit: Payer: Medicare Other | Admitting: Orthopaedic Surgery

## 2020-12-05 VITALS — Ht 67.0 in | Wt 156.0 lb

## 2020-12-05 DIAGNOSIS — G8929 Other chronic pain: Secondary | ICD-10-CM | POA: Diagnosis not present

## 2020-12-05 DIAGNOSIS — M25562 Pain in left knee: Secondary | ICD-10-CM

## 2020-12-05 DIAGNOSIS — M25561 Pain in right knee: Secondary | ICD-10-CM

## 2020-12-05 MED ORDER — BUPIVACAINE HCL 0.25 % IJ SOLN
2.0000 mL | INTRAMUSCULAR | Status: AC | PRN
Start: 2020-12-05 — End: 2020-12-05
  Administered 2020-12-05: 2 mL via INTRA_ARTICULAR

## 2020-12-05 NOTE — Progress Notes (Signed)
Office Visit Note   Patient: Edward Young           Date of Birth: 12/31/1950           MRN: [id number] Visit Date: 12/05/2020              Requested by: Royann Shivers, PA-C [address] [place],  [place] [address] PCP: Royann Shivers, PA-C   Assessment & Plan: Visit Diagnoses:  1. Chronic pain of both knees     Plan: [name] has been seen several times over the last month to 6 weeks for evaluation of recurrent swelling of his left knee.  I aspirated his knee several weeks ago and injected cortisone he notes that he was feeling well until several days ago.  He is not had any injury or trauma.  He does smoke and is on warfarin for atrial fibrillation.  Fluid analysis from 2 weeks ago revealed cloudy yellow fluid with 31,000 WBCs.  72% with PMNs and 27% mononuclear cells.  Had 7000 RBCs culture was negative and there were no crystals identified.  Prior films demonstrated some mild narrowing of the medial compartment consistent with arthritis but no acute changes.  I suspect the problem is related to the arthritis without evidence of infection.  I reaspirated his knee today of about 45 cc of somewhat cloudy fluid and will resend the fluid for analysis .no other joint complaints.  No fever or chills. Gram stain revealed no organisms.  No growth on aerobic and anaerobic cultures.  Follow-Up Instructions: Return if symptoms worsen or fail to improve.   Orders:  No orders of the defined types were placed in this encounter.  No orders of the defined types were placed in this encounter.     Procedures: Large Joint Inj: L knee on 12/05/2020 3:09 PM Indications: pain and diagnostic evaluation Details: 25 G 1.5 in needle, anteromedial approach  Arthrogram: No  Medications: 2 mL bupivacaine 0.25 % Aspirate: 45 mL cloudy and yellow; sent for lab analysis Procedure, treatment alternatives, risks and benefits explained, specific risks discussed. Consent was given by the  patient. Patient was prepped and draped in the usual sterile fashion.      Clinical Data: No additional findings.   Subjective: Chief Complaint  Patient presents with   Left Knee - Pain, Follow-up  Patient presents today for a two week follow up on his left knee. He was here two weeks ago and states that Dr.Whitfield aspirated and injected and his knee with cortisone. He said that the benefits were short and has been experiencing swelling and pain again. He is walking with the assistance of a walking stick. He is not taking anything for pain.   HPI  Review of Systems   Objective: Vital Signs: Ht 5\' 7"  (1.702 m)   Wt 156 lb (70.8 kg)   BMI 24.43 kg/m   Physical Exam Constitutional:      Appearance: He is well-developed.  Pulmonary:     Effort: Pulmonary effort is normal.  Skin:    General: Skin is warm and dry.  Neurological:     Mental Status: He is alert and oriented to person, place, and time.  Psychiatric:        Behavior: Behavior normal.    Ortho Exam left knee was a little bit warm but certainly not hot there was no redness.  There was some tenderness along the medial compartment but relatively mild.  Positive effusion.  Full extension  and flex probably 100 degrees without instability.  A little bit of a fullness in the popliteal space.  No calf pain.  No distal edema. Specialty Comments:  No specialty comments available.  Imaging: No results found.   PMFS History: Patient Active Problem List   Diagnosis Date Noted   Knee pain, bilateral 11/21/2020   Essential (primary) hypertension 03/29/2019   Recent cerebrovascular accident (CVA) 03/29/2019   Paroxysmal atrial fibrillation (HCC) 11/20/2016   Atrial fibrillation with RVR (HCC) 09/07/2016   Nicotine dependence 09/07/2016   Aortic atherosclerosis (HCC) 09/07/2016   Esophageal reflux 12/31/2015   Pain in joint, shoulder region 12/31/2015   Other malaise and fatigue 12/27/2015   Pneumonia due to  infectious organism 09/26/2014   Wheezing 08/21/2014   Cigarette smoker 06/04/2007   Palpitations 06/04/2007   Personal history of other diseases of circulatory system 06/04/2007   Past Medical History:  Diagnosis Date   Arthritis    GERD (gastroesophageal reflux disease)    PAF (paroxysmal atrial fibrillation) (HCC)    Pneumonia 2016   Stroke (HCC)     Family History  Problem Relation Age of Onset   Alzheimer's disease Mother    Hypertension Father    Parkinson's disease Brother    Kidney cancer Maternal Grandfather    Pneumonia Paternal Grandmother     Past Surgical History:  Procedure Laterality Date   ATRIAL FIBRILLATION ABLATION N/A 11/20/2016   Procedure: Atrial Fibrillation Ablation;  Surgeon: Hillis Range, MD;  Location: [facility];  Service: Cardiovascular;  Laterality: N/A;   FRACTURE SURGERY     NASAL FRACTURE SURGERY  ~ 2003   "opened it up so I could breath; it had been broken ~ 3 times"   Social History   Occupational History   Not on file  Tobacco Use   Smoking status: Every Day    Packs/day: 0.50    Years: 47.00    Pack years: 23.50    Types: Cigarettes    Start date: 02/24/1969   Smokeless tobacco: Never  Vaping Use   Vaping Use: Never used  Substance and Sexual Activity   Alcohol use: No   Drug use: No   Sexual activity: Not on file

## 2021-01-09 ENCOUNTER — Other Ambulatory Visit: Payer: Self-pay

## 2021-01-09 ENCOUNTER — Ambulatory Visit (INDEPENDENT_AMBULATORY_CARE_PROVIDER_SITE_OTHER): Payer: Medicare Other | Admitting: Orthopaedic Surgery

## 2021-01-09 ENCOUNTER — Encounter: Payer: Self-pay | Admitting: Orthopaedic Surgery

## 2021-01-09 VITALS — Ht 67.0 in | Wt 156.0 lb

## 2021-01-09 DIAGNOSIS — M25562 Pain in left knee: Secondary | ICD-10-CM

## 2021-01-09 DIAGNOSIS — G8929 Other chronic pain: Secondary | ICD-10-CM | POA: Diagnosis not present

## 2021-01-09 NOTE — Progress Notes (Signed)
PROCEDURE NOTE:  The patient request injection, verbal consent was obtained.  The left knee was prepped appropriately after time out was performed.   Sterile technique was observed and anesthesia was provided by ethyl chloride and a 20-gauge needle was used to inject the knee area.  A 16-gauge needle was then used to aspirate the knee.  Color of fluid aspirated was blood tinged  Total cc's aspirated was 10.    Injection of 1 cc of Celestone 6 mg with several cc's of plain xylocaine was then performed.  A band aid dressing was applied.  The patient was advised to apply ice later today and tomorrow to the injection sight as needed.  Return in six weeks.  Call if any problem.  Precautions discussed.  Electronically Signed Darreld Mclean, MD 9/14/202211:08 AM

## 2021-02-20 ENCOUNTER — Ambulatory Visit: Payer: Medicare Other | Admitting: Orthopaedic Surgery

## 2021-03-06 ENCOUNTER — Ambulatory Visit: Payer: Medicare Other | Admitting: Orthopaedic Surgery

## 2021-07-29 ENCOUNTER — Emergency Department (HOSPITAL_COMMUNITY): Payer: Medicare Other

## 2021-07-29 ENCOUNTER — Inpatient Hospital Stay (HOSPITAL_COMMUNITY)
Admission: EM | Admit: 2021-07-29 | Discharge: 2021-08-02 | DRG: 419 | Disposition: A | Discharge status: Home / Self Care | Payer: Medicare Other | Attending: Family Medicine | Admitting: Family Medicine

## 2021-07-29 ENCOUNTER — Encounter (HOSPITAL_COMMUNITY): Payer: Self-pay | Admitting: *Deleted

## 2021-07-29 DIAGNOSIS — Z720 Tobacco use: Secondary | ICD-10-CM | POA: Diagnosis not present

## 2021-07-29 DIAGNOSIS — K819 Cholecystitis, unspecified: Principal | ICD-10-CM

## 2021-07-29 DIAGNOSIS — I4891 Unspecified atrial fibrillation: Secondary | ICD-10-CM | POA: Diagnosis not present

## 2021-07-29 DIAGNOSIS — M1990 Unspecified osteoarthritis, unspecified site: Secondary | ICD-10-CM | POA: Diagnosis present

## 2021-07-29 DIAGNOSIS — I480 Paroxysmal atrial fibrillation: Secondary | ICD-10-CM | POA: Diagnosis present

## 2021-07-29 DIAGNOSIS — Z8249 Family history of ischemic heart disease and other diseases of the circulatory system: Secondary | ICD-10-CM | POA: Diagnosis not present

## 2021-07-29 DIAGNOSIS — Z7901 Long term (current) use of anticoagulants: Secondary | ICD-10-CM

## 2021-07-29 DIAGNOSIS — Z01810 Encounter for preprocedural cardiovascular examination: Secondary | ICD-10-CM | POA: Diagnosis not present

## 2021-07-29 DIAGNOSIS — Z820 Family history of epilepsy and other diseases of the nervous system: Secondary | ICD-10-CM

## 2021-07-29 DIAGNOSIS — R5382 Chronic fatigue, unspecified: Secondary | ICD-10-CM | POA: Diagnosis present

## 2021-07-29 DIAGNOSIS — I69341 Monoplegia of lower limb following cerebral infarction affecting right dominant side: Secondary | ICD-10-CM | POA: Diagnosis not present

## 2021-07-29 DIAGNOSIS — Z8051 Family history of malignant neoplasm of kidney: Secondary | ICD-10-CM | POA: Diagnosis not present

## 2021-07-29 DIAGNOSIS — K8020 Calculus of gallbladder without cholecystitis without obstruction: Secondary | ICD-10-CM | POA: Diagnosis not present

## 2021-07-29 DIAGNOSIS — K8070 Calculus of gallbladder and bile duct without cholecystitis without obstruction: Secondary | ICD-10-CM | POA: Diagnosis not present

## 2021-07-29 DIAGNOSIS — F17210 Nicotine dependence, cigarettes, uncomplicated: Secondary | ICD-10-CM | POA: Diagnosis present

## 2021-07-29 DIAGNOSIS — K8080 Other cholelithiasis without obstruction: Secondary | ICD-10-CM | POA: Diagnosis not present

## 2021-07-29 DIAGNOSIS — K8000 Calculus of gallbladder with acute cholecystitis without obstruction: Secondary | ICD-10-CM | POA: Diagnosis not present

## 2021-07-29 DIAGNOSIS — K8010 Calculus of gallbladder with chronic cholecystitis without obstruction: Principal | ICD-10-CM | POA: Diagnosis present

## 2021-07-29 DIAGNOSIS — K219 Gastro-esophageal reflux disease without esophagitis: Secondary | ICD-10-CM | POA: Diagnosis present

## 2021-07-29 DIAGNOSIS — Z79899 Other long term (current) drug therapy: Secondary | ICD-10-CM | POA: Diagnosis not present

## 2021-07-29 DIAGNOSIS — I10 Essential (primary) hypertension: Secondary | ICD-10-CM | POA: Diagnosis present

## 2021-07-29 DIAGNOSIS — Z8673 Personal history of transient ischemic attack (TIA), and cerebral infarction without residual deficits: Secondary | ICD-10-CM | POA: Diagnosis not present

## 2021-07-29 DIAGNOSIS — Z8701 Personal history of pneumonia (recurrent): Secondary | ICD-10-CM | POA: Diagnosis not present

## 2021-07-29 LAB — CBC WITH DIFFERENTIAL/PLATELET
Abs Immature Granulocytes: 0.02 10*3/uL (ref 0.00–0.07)
Basophils Absolute: 0.1 10*3/uL (ref 0.0–0.1)
Basophils Relative: 1 %
Eosinophils Absolute: 0.1 10*3/uL (ref 0.0–0.5)
Eosinophils Relative: 1 %
HCT: 47 % (ref 39.0–52.0)
Hemoglobin: 14.7 g/dL (ref 13.0–17.0)
Immature Granulocytes: 0 %
Lymphocytes Relative: 21 %
Lymphs Abs: 1.1 10*3/uL (ref 0.7–4.0)
MCH: 30.9 pg (ref 26.0–34.0)
MCHC: 31.3 g/dL (ref 30.0–36.0)
MCV: 98.7 fL (ref 80.0–100.0)
Monocytes Absolute: 0.5 10*3/uL (ref 0.1–1.0)
Monocytes Relative: 10 %
Neutro Abs: 3.6 10*3/uL (ref 1.7–7.7)
Neutrophils Relative %: 67 %
Platelets: 198 10*3/uL (ref 150–400)
RBC: 4.76 MIL/uL (ref 4.22–5.81)
RDW: 15.2 % (ref 11.5–15.5)
WBC: 5.4 10*3/uL (ref 4.0–10.5)
nRBC: 0 % (ref 0.0–0.2)

## 2021-07-29 LAB — COMPREHENSIVE METABOLIC PANEL
ALT: 14 U/L (ref 0–44)
AST: 26 U/L (ref 15–41)
Albumin: 3.8 g/dL (ref 3.5–5.0)
Alkaline Phosphatase: 77 U/L (ref 38–126)
Anion gap: 9 (ref 5–15)
BUN: 13 mg/dL (ref 8–23)
CO2: 25 mmol/L (ref 22–32)
Calcium: 9.3 mg/dL (ref 8.9–10.3)
Chloride: 106 mmol/L (ref 98–111)
Creatinine, Ser: 1.16 mg/dL (ref 0.61–1.24)
GFR, Estimated: >60 mL/min (ref >60)
Glucose, Bld: 107 mg/dL — ABNORMAL HIGH (ref 70–99)
Potassium: 3.6 mmol/L (ref 3.5–5.1)
Sodium: 140 mmol/L (ref 135–145)
Total Bilirubin: 2.2 mg/dL — ABNORMAL HIGH (ref 0.3–1.2)
Total Protein: 7.9 g/dL (ref 6.5–8.1)

## 2021-07-29 LAB — URINALYSIS, ROUTINE W REFLEX MICROSCOPIC
Bilirubin Urine: NEGATIVE
Glucose, UA: NEGATIVE mg/dL
Ketones, ur: NEGATIVE mg/dL
Leukocytes,Ua: NEGATIVE
Nitrite: NEGATIVE
Protein, ur: 30 mg/dL — AB
Specific Gravity, Urine: 1.024 (ref 1.005–1.030)
pH: 5 (ref 5.0–8.0)

## 2021-07-29 LAB — TROPONIN I (HIGH SENSITIVITY)
Troponin I (High Sensitivity): 26 ng/L — ABNORMAL HIGH (ref <18)
Troponin I (High Sensitivity): 21 ng/L — ABNORMAL HIGH (ref <18)

## 2021-07-29 LAB — PROTIME-INR
INR: 2.2 — ABNORMAL HIGH (ref 0.8–1.2)
Prothrombin Time: 24.2 seconds — ABNORMAL HIGH (ref 11.4–15.2)

## 2021-07-29 LAB — LIPASE, BLOOD: Lipase: 38 U/L (ref 11–51)

## 2021-07-29 MED ORDER — LORAZEPAM 0.5 MG PO TABS
0.5000 mg | ORAL_TABLET | Freq: Once | ORAL | Status: AC
  Administered 2021-07-29: 0.5 mg via ORAL
  Filled 2021-07-29: qty 1

## 2021-07-29 MED ORDER — DIPHENHYDRAMINE HCL 25 MG PO CAPS
25.0000 mg | ORAL_CAPSULE | Freq: Once | ORAL | Status: AC
  Administered 2021-07-29: 25 mg via ORAL
  Filled 2021-07-29: qty 1

## 2021-07-29 MED ORDER — LACTATED RINGERS IV BOLUS
1000.0000 mL | Freq: Once | INTRAVENOUS | Status: AC
  Administered 2021-07-29: 1000 mL via INTRAVENOUS

## 2021-07-29 MED ORDER — DILTIAZEM HCL-DEXTROSE 125-5 MG/125ML-% IV SOLN (PREMIX)
5.0000 mg/h | INTRAVENOUS | Status: DC
  Administered 2021-07-29: 5 mg/h via INTRAVENOUS
  Filled 2021-07-29 (×2): qty 125

## 2021-07-29 MED ORDER — IOHEXOL 300 MG/ML  SOLN
100.0000 mL | Freq: Once | INTRAMUSCULAR | Status: AC | PRN
  Administered 2021-07-29: 100 mL via INTRAVENOUS

## 2021-07-29 MED ORDER — SODIUM CHLORIDE 0.9 % IV SOLN
1.0000 g | Freq: Once | INTRAVENOUS | Status: AC
  Administered 2021-07-29: 1 g via INTRAVENOUS
  Filled 2021-07-29: qty 10

## 2021-07-29 MED ORDER — LORATADINE 10 MG PO TABS
10.0000 mg | ORAL_TABLET | Freq: Once | ORAL | Status: AC
  Administered 2021-07-29: 10 mg via ORAL
  Filled 2021-07-29: qty 1

## 2021-07-29 MED ORDER — DILTIAZEM HCL 25 MG/5ML IV SOLN
10.0000 mg | Freq: Once | INTRAVENOUS | Status: AC
  Administered 2021-07-29: 10 mg via INTRAVENOUS
  Filled 2021-07-29: qty 5

## 2021-07-29 MED ORDER — DILTIAZEM LOAD VIA INFUSION
10.0000 mg | Freq: Once | INTRAVENOUS | Status: AC
  Administered 2021-07-29: 10 mg via INTRAVENOUS
  Filled 2021-07-29: qty 10

## 2021-07-29 NOTE — Progress Notes (Signed)
[facility] ? ?Patient with abdominal pain and stones on CT elevated bilirubin. Will treat like cholecystitis for now ultrasound in the morning, trend liver test.  ?Hold Coumadin INR is 2.2. Repeat INR in the morning. ?Ceftrizone now  ?Npo midnight ? ?Hospitalist admission ?Curlene Labrum MD

## 2021-07-29 NOTE — ED Notes (Signed)
Given pt urinal and informed we need a urine sample  ?

## 2021-07-29 NOTE — Assessment & Plan Note (Addendum)
-  Symptomatic with abdominal pain and nausea ?-On today's evaluation still having discomfort mid epigastric area and right upper quadrant on deep palpation.  ?-Patient reports symptoms are controlled with current analgesics. ?-Right upper quadrant ultrasound failed to further assist in evaluating his condition due to buildup of gas obscuring images.   ?-No signs of fever or elevated WBCs: ?-After discussing with general surgery will continue treatment with antibiotics, allow for clear liquid diet and plan for cholecystectomy on 07/31/2021 ?-Patient has been seen by cardiology and clear for surgical intervention.  ?-Stable LFTs, total bilirubin has come down from 2.2 on admission to 2.0 at this moment. ?

## 2021-07-29 NOTE — ED Provider Notes (Signed)
3:50 PM-checkout from Dr. Jeannine Kitten to evaluate patient after completion of treatment including labs, and CT images.  He is being evaluated for possible head injury, and nonspecific causes of abdominal pain and weakness. ? ?4:55 PM-delta troponin negative.  C-Met normal except glucose high, total bilirubin high.  PT and INR-2.2.  Urinalysis mild nonspecific abnormalities.  CBC normal.  CT head-no injury, possible right maxillary sinusitis.  CT abdomen pelvis, small right pleural effusion and right lower lobe atelectasis.  Also gallstones present without signs for cholecystitis. ? ?At this time, the patient is lying supine and appears comfortable.  He states that for the last 2 weeks he has been feeling bad and having left-sided abdominal pain.  He is also anorexic and having nausea.  He has not vomited.  He has had some chills, today.  He has a sensation of squeezing in his chest.  He has been having a sensation of generalized itching for 2 weeks at this time abdominal exam is remarkable for nontender left upper quadrant and mild tenderness right upper quadrant.  There is no rebound tenderness. ? ?6:05 PM-Case discussed with general surgeon, Dr. Constance Haw who recommends treatment for cholecystitis, keep n.p.o. after midnight, obtain abdominal ultrasound in the morning, trend LFTs, anticipate surgical management tomorrow if stable.  Patient has not taken his warfarin today yet.  At this time he remains tachycardic after initial single push of Cardizem, 10 mg at 1535.  Will reload with Cardizem and start drip.  Patient nontoxic at this time he has been able to eat crackers without vomiting. ? ?.Critical Care ?Performed by: Daleen Bo, MD ?Authorized by: Daleen Bo, MD  ? ?Critical care provider statement:  ?  Critical care time (minutes):  35 ?  Critical care start time:  07/29/2021 6:16 PM ?  Critical care time was exclusive of:  Separately billable procedures and treating other patients ?  Critical care was  necessary to treat or prevent imminent or life-threatening deterioration of the following conditions:  Cardiac failure ?  Critical care was time spent personally by me on the following activities:  Blood draw for specimens, development of treatment plan with patient or surrogate, discussions with consultants, evaluation of patient's response to treatment, examination of patient, ordering and performing treatments and interventions, ordering and review of laboratory studies, ordering and review of radiographic studies, pulse oximetry, re-evaluation of patient's condition and review of old charts  ? ?MDM-patient presenting with malaise and nonspecific abdominal symptoms, with finding for gallstones and A-fib RVR.  Apparently he is not taking his nighttime Cardizem.  Unclear how long he has been tachycardic.  He is anticoagulated with Coumadin.  We will request hospitalist to hold Coumadin, anticipating upcoming surgery for treatment of symptomatic gallstones.  Will cover for cholecystitis with Rocephin.  Have discussed with general surgery, Dr. Constance Haw for surgical consultation.  She will see the patient in the morning and request that he be n.p.o. after midnight. ? ?6:14 PM-Consult complete with hospitalist. Patient case explained and discussed.  She agrees to admit patient for further evaluation and treatment. Call ended at 6:39 PM ?  ?Daleen Bo, MD ?07/29/21 1839 ? ?

## 2021-07-29 NOTE — ED Triage Notes (Signed)
States he has a history of gallstones and is having nausea and pain, states they googled gallstones and he has all the symptoms ?

## 2021-07-29 NOTE — Assessment & Plan Note (Addendum)
-  History of atrial fibrillation on anticoagulation, also on metoprolol.  ?-Patient reported compliance with his medications.  Last warfarin dose was night prior to admission.   ?-INR is still therapeutic.   ?-Receive transient use of Cardizem drip and is now once again rate controlled; Cardizem drip has been discontinue and the patient started on metoprolol 50 mg twice a day. ?-Patient reports no chest pain and echo in 2018 with preserved ejection fraction and no significant valvular disorder.   ?-Case has been discussed with cardiology service who has cleared patient for surgery ?-Coumadin on hold for anticipated surgical procedure on 07/31/2021 ?-Continue to follow clinical response.   ?-Will move patient to telemetry bed.  ?

## 2021-07-29 NOTE — H&P (Signed)
?History and Physical  ? ? ?Edward Young [id number] DOB: February 19, 1951 DOA: 07/29/2021 ? ?PCP: Rosalee Kaufman, PA-C  ? ?Patient coming from: Home ? ?I have personally briefly reviewed patient's old medical records in [place] ? ?Chief Complaint: Abdominal and chest pain ? ?HPI: Edward Young is a 71 y.o. male with medical history significant for Atria fib on chronic anticoagulation with warfarin, tobacco abuse, stroke. ?Patient presented to the ED with complaints of need chest pain ongoing for the past 2 weeks to months.  Reports onset also of upper abdominal pain mid abdomen and left side.  Reports poor oral intake, and weakness.  Reports subjective fevers and chills.  No vomiting no loose stools. ?He describes chest pain "light" and "squeezing", and nonradiating.  Chest pains are not related to activity.  He reports he has had atrial fibrillation for about 20 years and has never had chest pains.  He denies palpitations.  No difficulty breathing.  He reports dizziness 2 days with resultant fall, but did not hit his head. ? ?ED Course: Temperature 97.5.  Heart rate ranging from 39-158.  Respiratory rate 17-24.  Blood pressure systolic 0000000.  O2 sats 94 to 100% on room air. ?Abdominal CT shows cholelithiasis with multiple intraluminal stones.  No findings to suggest cholecystitis. ?Total bilirubin 2.2.  Lipase normal at 38. ?General surgeon Dr. Constance Haw was consulted, treating as cholecystitis for now, obtain ultrasound in the morning trend liver enzymes, hold Coumadin, check INR, n.p.o. midnight. ?10 mg Cardizem bolus given, with Cardizem drip, 1 L bolus fluids given, ceftriaxone started. ? ?Review of Systems: As per HPI all other systems reviewed and negative. ? ?Past Medical History:  ?Diagnosis Date  ? Arthritis   ? GERD (gastroesophageal reflux disease)   ? PAF (paroxysmal atrial fibrillation) ([place])   ? Pneumonia 2016  ? Stroke [facility])   ? ? ?Past Surgical History:  ?Procedure Laterality Date  ?  ATRIAL FIBRILLATION ABLATION N/A 11/20/2016  ? Procedure: Atrial Fibrillation Ablation;  Surgeon: Thompson Grayer, MD;  Location: [facility];  Service: Cardiovascular;  Laterality: N/A;  ? FRACTURE SURGERY    ? NASAL FRACTURE SURGERY  ~ 2003  ? "opened it up so I could breath; it had been broken ~ 3 times"  ? ? ? reports that he has been smoking cigarettes. He started smoking about 52 years ago. He has a 23.50 pack-year smoking history. He has never used smokeless tobacco. He reports that he does not drink alcohol and does not use drugs. ? ?Allergies  ?Allergen Reactions  ? Banana Anaphylaxis  ? ? ?Family History  ?Problem Relation Age of Onset  ? Alzheimer's disease Mother   ? Hypertension Father   ? Parkinson's disease Brother   ? Kidney cancer Maternal Grandfather   ? Pneumonia Paternal Grandmother   ? ?Prior to Admission medications   ?Medication Sig Start Date End Date Taking? Authorizing Provider  ?acetaminophen (TYLENOL) 500 MG tablet Take 500 mg by mouth every 6 (six) hours as needed for moderate pain.   Yes [provider]  ?calcium carbonate (TUMS - DOSED IN MG ELEMENTAL CALCIUM) 500 MG chewable tablet Chew 1 tablet by mouth as needed for indigestion or heartburn.   Yes [provider]  ?lactobacillus acidophilus (BACID) TABS tablet Take 1 tablet by mouth daily.   Yes [provider]  ?metoprolol tartrate (LOPRESSOR) 25 MG tablet Take 25 mg by mouth 2 (two) times daily. 04/14/19  Yes [provider]  ?warfarin (COUMADIN)  5 MG tablet Take 5 mg by mouth every evening. 07/10/20  Yes [provider]  ?diltiazem (CARDIZEM) 30 MG tablet Take 1 tablet (30 mg total) by mouth as needed (palpitations). ?Patient not taking: Reported on 07/29/2021 10/31/16   Satira Sark, MD  ? ? ?Physical Exam: ?Vitals:  ? 07/29/21 1540 07/29/21 1600 07/29/21 1700 07/29/21 1800  ?BP: (!) 110/91 (!) 116/102 (!) 121/102 133/85  ?Pulse: (!) 46 (!) 39 76 84  ?Resp: (!) 21 20 (!) 23 19   ?Temp:      ?TempSrc:      ?SpO2: (!) 83% 95% 96% 97%  ?Height:      ? ? ?Constitutional: NAD, calm, comfortable ?Vitals:  ? 07/29/21 1540 07/29/21 1600 07/29/21 1700 07/29/21 1800  ?BP: (!) 110/91 (!) 116/102 (!) 121/102 133/85  ?Pulse: (!) 46 (!) 39 76 84  ?Resp: (!) 21 20 (!) 23 19  ?Temp:      ?TempSrc:      ?SpO2: (!) 83% 95% 96% 97%  ?Height:      ? ?Eyes: PERRL, lids and conjunctivae normal ?ENMT: Mucous membranes are dry. Marland Kitchen  ?Neck: normal, supple, no masses, no thyromegaly ?Respiratory: clear to auscultation bilaterally, no wheezing, no crackles. Normal respiratory effort. No accessory muscle use.  ?Cardiovascular: irregular rate and rhythm, no murmurs / rubs / gallops. No extremity edema. 2+ pedal pulses. No carotid bruits.  ?Abdomen: no tenderness, no masses palpated. No hepatosplenomegaly. Bowel sounds positive.  ?Musculoskeletal: no clubbing / cyanosis. No joint deformity upper and lower extremities.  ?Skin: no rashes, lesions, ulcers. No induration ?Neurologic: No apparent cranial nerve abnormality moving extremities spontaneously. ?Psychiatric: Normal judgment and insight. Alert and oriented x 3. Normal mood.  ? ?Labs on Admission: I have personally reviewed following labs and imaging studies ? ?CBC: ?Recent Labs  ?Lab 07/29/21 ?1302  ?WBC 5.4  ?NEUTROABS 3.6  ?HGB 14.7  ?HCT 47.0  ?MCV 98.7  ?PLT 198  ? ?Basic Metabolic Panel: ?Recent Labs  ?Lab 07/29/21 ?1302  ?NA 140  ?K 3.6  ?CL 106  ?CO2 25  ?GLUCOSE 107*  ?BUN 13  ?CREATININE 1.16  ?CALCIUM 9.3  ? ?GFR: ?CrCl cannot be calculated (Unknown ideal weight.). ?Liver Function Tests: ?Recent Labs  ?Lab 07/29/21 ?1302  ?AST 26  ?ALT 14  ?ALKPHOS 77  ?BILITOT 2.2*  ?PROT 7.9  ?ALBUMIN 3.8  ? ?Recent Labs  ?Lab 07/29/21 ?1302  ?LIPASE 38  ? ?No results for input(s): AMMONIA in the last 168 hours. ?Coagulation Profile: ?Recent Labs  ?Lab 07/29/21 ?1302  ?INR 2.2*  ? ?Urine analysis: ?   ?Component Value Date/Time  ? COLORURINE YELLOW 07/29/2021 1540  ?  APPEARANCEUR CLEAR 07/29/2021 1540  ? LABSPEC 1.024 07/29/2021 1540  ? PHURINE 5.0 07/29/2021 1540  ? GLUCOSEU NEGATIVE 07/29/2021 1540  ? HGBUR SMALL (A) 07/29/2021 1540  ? [place] NEGATIVE 07/29/2021 1540  ? [place] NEGATIVE 07/29/2021 1540  ? PROTEINUR 30 (A) 07/29/2021 1540  ? NITRITE NEGATIVE 07/29/2021 1540  ? LEUKOCYTESUR NEGATIVE 07/29/2021 1540  ? ? ?Radiological Exams on Admission: ?DG Chest 2 View ? ?Result Date: 07/29/2021 ?CLINICAL DATA:  Left-sided chest pain EXAM: CHEST - 2 VIEW COMPARISON:  09/25/2020 FINDINGS: The heart size and mediastinal contours are within normal limits. Aortic atherosclerosis. Chronically coarsened interstitial markings bilaterally. No focal airspace consolidation, pleural effusion, or pneumothorax. The visualized skeletal structures are unremarkable. IMPRESSION: No active cardiopulmonary disease. Electronically Signed   By: Davina Poke D.O.   On: 07/29/2021 14:16  ? ?  CT Head Wo Contrast ? ?Result Date: 07/29/2021 ?CLINICAL DATA:  Weakness.  History of stroke. EXAM: CT HEAD WITHOUT CONTRAST TECHNIQUE: Contiguous axial images were obtained from the base of the skull through the vertex without intravenous contrast. RADIATION DOSE REDUCTION: This exam was performed according to the departmental dose-optimization program which includes automated exposure control, adjustment of the mA and/or kV according to patient size and/or use of iterative reconstruction technique. COMPARISON:  MRI brain dated March 14, 2019. CT head dated March 12, 2019. FINDINGS: Brain: No evidence of acute infarction, hemorrhage, hydrocephalus, extra-axial collection or mass lesion/mass effect. Mild generalized cerebral atrophy. Mild-to-moderate chronic microvascular ischemic changes. Vascular: Atherosclerotic vascular calcification of the carotid siphons. No hyperdense vessel. Skull: Normal. Negative for fracture or focal lesion. Sinuses/Orbits: Right maxillary sinus mucosal thickening with  small air-fluid level. Remaining paranasal sinuses and mastoid air cells are clear. The orbits are unremarkable. Other: None. IMPRESSION: 1. No acute intracranial abnormality. 2. Right maxillary sinus disease wit

## 2021-07-29 NOTE — ED Notes (Signed)
Pt has a Hx of A.fib, states he did take his medication for it this morning. Pt appears to be in A.fib on heart monitor with a rate of 120s-130s, MD made aware  ?

## 2021-07-29 NOTE — ED Provider Notes (Signed)
?[facility] ?Provider Note ? ? ?CSN: [id number] ?Arrival date & time: 07/29/21  1206 ? ?  ? ?History ? ?Chief Complaint  ?Patient presents with  ? Abdominal Pain  ? ? ?Edward Young is a 71 y.o. male. ? ?HPI ?71 year old male with a history of paroxysmal A-fib on warfarin, previous stroke, and arthritis presents with abdominal pain and generalized weakness.  History is from both patient and wife.  Patient been feeling poorly with on and off abdominal and left-sided chest wall pain for about 2 weeks - 1 month.  He has been getting progressively weaker and not eating as much and has had some nausea.  Back in August 2022 he was told he had gallstones but then no procedure was performed.  He is concerned that that is what is causing his symptoms now.  His symptoms are primarily left flank and back as well as left-sided abdominal.  He is also having some chest squeezing sensation over the last 2 days or so.  Thinks this might be from his paroxysmal A-fib.  Has not felt any palpitations.  Has had a subjective fever and chills, mostly at night, for about 2 weeks.  Right now he has minimal to no pain but recently had pain just prior to arrival. Has chronic right leg weakness from prior stroke.  He has all been itchy for the last 2-4 weeks.  A couple days ago the patient was feeling so weak and woozy that he fell and hit his head on the ground.  He has tenderness above his left eyebrow whenever he touches it but otherwise denies a headache. ? ?Home Medications ?Prior to Admission medications   ?Medication Sig Start Date End Date Taking? Authorizing Provider  ?acetaminophen (TYLENOL) 500 MG tablet Take 500 mg by mouth every 6 (six) hours as needed for moderate pain.   Yes [provider]  ?calcium carbonate (TUMS - DOSED IN MG ELEMENTAL CALCIUM) 500 MG chewable tablet Chew 1 tablet by mouth as needed for indigestion or heartburn.   Yes [provider]  ?lactobacillus acidophilus (BACID)  TABS tablet Take 1 tablet by mouth daily.   Yes [provider]  ?metoprolol tartrate (LOPRESSOR) 25 MG tablet Take 25 mg by mouth 2 (two) times daily. 04/14/19  Yes [provider]  ?warfarin (COUMADIN) 5 MG tablet Take 5 mg by mouth every evening. 07/10/20  Yes [provider]  ?diltiazem (CARDIZEM) 30 MG tablet Take 1 tablet (30 mg total) by mouth as needed (palpitations). ?Patient not taking: Reported on 07/29/2021 10/31/16   Satira Sark, MD  ?   ? ?Allergies    ?Banana   ? ?Review of Systems   ?Review of Systems  ?Constitutional:  Positive for chills, fatigue and fever.  ?Respiratory:  Positive for shortness of breath.   ?Cardiovascular:  Positive for chest pain. Negative for palpitations.  ?Gastrointestinal:  Positive for abdominal pain and nausea. Negative for vomiting.  ?Musculoskeletal:  Positive for back pain.  ?Neurological:  Positive for weakness.  ? ?Physical Exam ?Updated Vital Signs ?BP (!) 125/93 (BP Location: Right Arm)   Pulse 92   Temp (!) 97.5 ?F (36.4 ?C) (Oral)   Resp 17   Ht 5\' 8"  (1.727 m)   SpO2 100%   BMI 23.72 kg/m?  ?Physical Exam ?Vitals and nursing note reviewed.  ?Constitutional:   ?   Appearance: He is well-developed.  ?HENT:  ?   Head: Normocephalic and atraumatic.  ?Cardiovascular:  ?  Rate and Rhythm: Tachycardia present. Rhythm irregular.  ?   Heart sounds: Normal heart sounds.  ?Pulmonary:  ?   Effort: Pulmonary effort is normal.  ?   Breath sounds: Normal breath sounds.  ?Chest:  ?   Comments: Tenderness along left mid-axillary chest wall/ribs. No rash/zoster ?Abdominal:  ?   Palpations: Abdomen is soft.  ?   Tenderness: There is generalized abdominal tenderness. There is no right CVA tenderness or left CVA tenderness.  ?Skin: ?   General: Skin is warm and dry.  ?Neurological:  ?   Mental Status: He is alert.  ? ? ?ED Results / Procedures / Treatments   ?Labs ?(all labs ordered are listed, but only abnormal results are displayed) ?Labs  Reviewed  ?COMPREHENSIVE METABOLIC PANEL - Abnormal; Notable for the following components:  ?    Result Value  ? Glucose, Bld 107 (*)   ? Total Bilirubin 2.2 (*)   ? All other components within normal limits  ?PROTIME-INR - Abnormal; Notable for the following components:  ? Prothrombin Time 24.2 (*)   ? INR 2.2 (*)   ? All other components within normal limits  ?TROPONIN I (HIGH SENSITIVITY) - Abnormal; Notable for the following components:  ? Troponin I (High Sensitivity) 26 (*)   ? All other components within normal limits  ?CBC WITH DIFFERENTIAL/PLATELET  ?LIPASE, BLOOD  ?URINALYSIS, ROUTINE W REFLEX MICROSCOPIC  ?TROPONIN I (HIGH SENSITIVITY)  ? ? ?EKG ?EKG Interpretation ? ?Date/Time:  Monday July 29 2021 12:50:57 EDT ?Ventricular Rate:  122 ?PR Interval:    ?QRS Duration: 92 ?QT Interval:  343 ?QTC Calculation: 489 ?R Axis:   251 ?Text Interpretation: Atrial fibrillation Left anterior fascicular block Borderline T wave abnormalities Borderline prolonged QT interval Confirmed by Sherwood Gambler [id number]) on 07/29/2021 3:29:05 PM ? ?Radiology ?DG Chest 2 View ? ?Result Date: 07/29/2021 ?CLINICAL DATA:  Left-sided chest pain EXAM: CHEST - 2 VIEW COMPARISON:  09/25/2020 FINDINGS: The heart size and mediastinal contours are within normal limits. Aortic atherosclerosis. Chronically coarsened interstitial markings bilaterally. No focal airspace consolidation, pleural effusion, or pneumothorax. The visualized skeletal structures are unremarkable. IMPRESSION: No active cardiopulmonary disease. Electronically Signed   By: Davina Poke D.O.   On: 07/29/2021 14:16   ? ?Procedures ?Procedures  ? ? ?Medications Ordered in ED ?Medications  ?iohexol (OMNIPAQUE) 300 MG/ML solution 100 mL (has no administration in time range)  ?lactated ringers bolus 1,000 mL (1,000 mLs Intravenous New Bag/Given 07/29/21 1328)  ?diltiazem (CARDIZEM) injection 10 mg (10 mg Intravenous Given 07/29/21 1535)  ?diphenhydrAMINE (BENADRYL) capsule 25 mg  (25 mg Oral Given 07/29/21 1535)  ? ? ?ED Course/ Medical Decision Making/ A&P ?  ?                        ?Medical Decision Making ?Amount and/or Complexity of Data Reviewed ?Labs: ordered. ?Radiology: ordered. ? ?Risk ?Prescription drug management. ? ? ? ?Patient with multiple complaints, hard to tell if this is all related or not.  He is in atrial fibrillation with RVR.  He was given an IV fluid bolus given poor p.o. intake and generalized weakness.  No focal weakness besides the right leg, which is a chronic problem since his stroke.  Chest x-ray shows no rib fractures or obvious pneumonia/pneumothorax.  I personally reviewed/interpreted these images.  Initial troponin is 26 and while he does have some chest discomfort and has been for a while, this is probably more rate related from  A-fib and we will need a second.  CT head will be obtained given he fell on warfarin to rule out a bleed/delayed bleed given he fell a couple days ago with some residual headache.  CT abdomen and pelvis is still pending as far as his abdominal/back discomfort. Care to Dr. Eulis Foster. ? ? ? ? ? ? ? ?Final Clinical Impression(s) / ED Diagnoses ?Final diagnoses:  ?None  ? ? ?Rx / DC Orders ?ED Discharge Orders   ? ? None  ? ?  ? ? ?  ?Sherwood Gambler, MD ?07/29/21 1539 ? ?

## 2021-07-30 ENCOUNTER — Inpatient Hospital Stay (HOSPITAL_COMMUNITY): Payer: Medicare Other

## 2021-07-30 ENCOUNTER — Other Ambulatory Visit: Payer: Self-pay

## 2021-07-30 DIAGNOSIS — I4891 Unspecified atrial fibrillation: Secondary | ICD-10-CM | POA: Diagnosis not present

## 2021-07-30 DIAGNOSIS — K219 Gastro-esophageal reflux disease without esophagitis: Secondary | ICD-10-CM | POA: Diagnosis not present

## 2021-07-30 DIAGNOSIS — I10 Essential (primary) hypertension: Secondary | ICD-10-CM | POA: Diagnosis not present

## 2021-07-30 DIAGNOSIS — K8020 Calculus of gallbladder without cholecystitis without obstruction: Secondary | ICD-10-CM

## 2021-07-30 DIAGNOSIS — Z720 Tobacco use: Secondary | ICD-10-CM | POA: Diagnosis present

## 2021-07-30 DIAGNOSIS — K8000 Calculus of gallbladder with acute cholecystitis without obstruction: Secondary | ICD-10-CM

## 2021-07-30 DIAGNOSIS — Z01810 Encounter for preprocedural cardiovascular examination: Secondary | ICD-10-CM

## 2021-07-30 LAB — COMPREHENSIVE METABOLIC PANEL
ALT: 13 U/L (ref 0–44)
AST: 24 U/L (ref 15–41)
Albumin: 3.3 g/dL — ABNORMAL LOW (ref 3.5–5.0)
Alkaline Phosphatase: 64 U/L (ref 38–126)
Anion gap: 8 (ref 5–15)
BUN: 11 mg/dL (ref 8–23)
CO2: 24 mmol/L (ref 22–32)
Calcium: 8.7 mg/dL — ABNORMAL LOW (ref 8.9–10.3)
Chloride: 106 mmol/L (ref 98–111)
Creatinine, Ser: 1.01 mg/dL (ref 0.61–1.24)
GFR, Estimated: >60 mL/min (ref >60)
Glucose, Bld: 94 mg/dL (ref 70–99)
Potassium: 4.1 mmol/L (ref 3.5–5.1)
Sodium: 138 mmol/L (ref 135–145)
Total Bilirubin: 2 mg/dL — ABNORMAL HIGH (ref 0.3–1.2)
Total Protein: 6.7 g/dL (ref 6.5–8.1)

## 2021-07-30 LAB — PROTIME-INR
INR: 2.4 — ABNORMAL HIGH (ref 0.8–1.2)
Prothrombin Time: 26 seconds — ABNORMAL HIGH (ref 11.4–15.2)

## 2021-07-30 LAB — CBC
HCT: 43 % (ref 39.0–52.0)
Hemoglobin: 13.8 g/dL (ref 13.0–17.0)
MCH: 31.7 pg (ref 26.0–34.0)
MCHC: 32.1 g/dL (ref 30.0–36.0)
MCV: 98.9 fL (ref 80.0–100.0)
Platelets: 185 10*3/uL (ref 150–400)
RBC: 4.35 MIL/uL (ref 4.22–5.81)
RDW: 15.5 % (ref 11.5–15.5)
WBC: 5.9 10*3/uL (ref 4.0–10.5)
nRBC: 0 % (ref 0.0–0.2)

## 2021-07-30 LAB — MRSA NEXT GEN BY PCR, NASAL: MRSA by PCR Next Gen: NOT DETECTED

## 2021-07-30 MED ORDER — METOPROLOL TARTRATE 50 MG PO TABS
50.0000 mg | ORAL_TABLET | Freq: Two times a day (BID) | ORAL | Status: DC
  Administered 2021-07-30 – 2021-07-31 (×3): 50 mg via ORAL
  Filled 2021-07-30 (×3): qty 1

## 2021-07-30 MED ORDER — ONDANSETRON HCL 4 MG PO TABS: 4.0000 mg | ORAL_TABLET | Freq: Four times a day (QID) | ORAL | Status: DC | PRN

## 2021-07-30 MED ORDER — SODIUM CHLORIDE 0.9 % IV SOLN
1.0000 g | Freq: Once | INTRAVENOUS | Status: AC
  Administered 2021-07-30: 1 g via INTRAVENOUS
  Filled 2021-07-30: qty 10

## 2021-07-30 MED ORDER — ACETAMINOPHEN 650 MG RE SUPP: 650.0000 mg | Freq: Four times a day (QID) | RECTAL | Status: DC | PRN

## 2021-07-30 MED ORDER — POTASSIUM CHLORIDE IN NACL 20-0.9 MEQ/L-% IV SOLN: INTRAVENOUS | Status: AC

## 2021-07-30 MED ORDER — SODIUM CHLORIDE 0.9 % IV SOLN
2.0000 g | INTRAVENOUS | Status: DC
  Filled 2021-07-30: qty 2

## 2021-07-30 MED ORDER — METRONIDAZOLE 500 MG/100ML IV SOLN
500.0000 mg | Freq: Two times a day (BID) | INTRAVENOUS | Status: DC
  Administered 2021-07-30 – 2021-08-01 (×6): 500 mg via INTRAVENOUS
  Filled 2021-07-30 (×6): qty 100

## 2021-07-30 MED ORDER — CHLORHEXIDINE GLUCONATE CLOTH 2 % EX PADS: 6.0000 | MEDICATED_PAD | Freq: Every day | CUTANEOUS | Status: DC

## 2021-07-30 MED ORDER — ALBUTEROL SULFATE (2.5 MG/3ML) 0.083% IN NEBU
2.5000 mg | INHALATION_SOLUTION | RESPIRATORY_TRACT | Status: DC | PRN
  Administered 2021-07-31 (×2): 2.5 mg via RESPIRATORY_TRACT
  Filled 2021-07-30 (×2): qty 3

## 2021-07-30 MED ORDER — NICOTINE 14 MG/24HR TD PT24
14.0000 mg | MEDICATED_PATCH | Freq: Every day | TRANSDERMAL | Status: DC
  Administered 2021-08-01 – 2021-08-02 (×2): 14 mg via TRANSDERMAL
  Filled 2021-07-30 (×4): qty 1

## 2021-07-30 MED ORDER — PANTOPRAZOLE SODIUM 40 MG PO TBEC
40.0000 mg | DELAYED_RELEASE_TABLET | Freq: Every day | ORAL | Status: DC
  Administered 2021-07-30 – 2021-08-02 (×3): 40 mg via ORAL
  Filled 2021-07-30 (×4): qty 1

## 2021-07-30 MED ORDER — PHYTONADIONE 5 MG PO TABS
2.5000 mg | ORAL_TABLET | Freq: Once | ORAL | Status: AC
  Administered 2021-07-30: 2.5 mg via ORAL
  Filled 2021-07-30: qty 1

## 2021-07-30 MED ORDER — ONDANSETRON HCL 4 MG/2ML IJ SOLN
4.0000 mg | Freq: Four times a day (QID) | INTRAMUSCULAR | Status: DC | PRN
  Administered 2021-07-31 – 2021-08-02 (×2): 4 mg via INTRAVENOUS
  Filled 2021-07-30 (×2): qty 2

## 2021-07-30 MED ORDER — ACETAMINOPHEN 325 MG PO TABS
650.0000 mg | ORAL_TABLET | Freq: Four times a day (QID) | ORAL | Status: DC | PRN
  Administered 2021-07-30: 650 mg via ORAL
  Filled 2021-07-30: qty 2

## 2021-07-30 MED ORDER — SODIUM CHLORIDE 0.9 % IV SOLN
2.0000 g | INTRAVENOUS | Status: DC
  Administered 2021-07-30 – 2021-08-01 (×3): 2 g via INTRAVENOUS
  Filled 2021-07-30 (×4): qty 20

## 2021-07-30 MED ORDER — POLYETHYLENE GLYCOL 3350 17 G PO PACK: 17.0000 g | PACK | Freq: Every day | ORAL | Status: DC | PRN

## 2021-07-30 MED ORDER — METOPROLOL TARTRATE 25 MG PO TABS
25.0000 mg | ORAL_TABLET | Freq: Two times a day (BID) | ORAL | Status: DC
  Administered 2021-07-30: 25 mg via ORAL
  Filled 2021-07-30: qty 1

## 2021-07-30 NOTE — Consult Note (Signed)
[facility] Consult ? ?Reason for Consult: Cholecystitis? Stones  ?Referring Physician:  Dr. Gwenlyn Perking  ? ?Chief Complaint   ?Abdominal Pain ?  ? ? ?HPI: Edward Young is a 71 y.o. male with prior stroke, AF on coumadin who came to the ED with complaints of worsening epigastric pain and nausea, bloating, chest pain for the past 2 weeks. He says that he has been having the same epigastric pain in the past and was seen at [facility] and diagnosed with gallstones but was told he needed an EGD first given his symptoms. ? ?He denies pain specifically with greasy or fatty foods but says he hurts in that region all the time now. He had a CT that demonstrated a gallbladder full of gallstones in really no ability to tell how thick the wall is due to the compaction. We attempted [place] and this was not able to visualize the gallbladder due to colon gas.  He says that he is seen by Cardiology for A fib and had a stroke years ago after falling downstairs and hitting his head. He had arm weakness that resolved.  ? ?He denies any pressure on his chest. He points to the epigastric region when he is describing the chest pain.  ? ? ?Past Medical History:  ?Diagnosis Date  ? Arthritis   ? GERD (gastroesophageal reflux disease)   ? PAF (paroxysmal atrial fibrillation) ([facility])   ? Pneumonia 2016  ? Stroke [facility])   ? ? ?Past Surgical History:  ?Procedure Laterality Date  ? ATRIAL FIBRILLATION ABLATION N/A 11/20/2016  ? Procedure: Atrial Fibrillation Ablation;  Surgeon: Hillis Range, MD;  Location: [facility];  Service: Cardiovascular;  Laterality: N/A;  ? FRACTURE SURGERY    ? NASAL FRACTURE SURGERY  ~ 2003  ? "opened it up so I could breath; it had been broken ~ 3 times"  ? ? ?Family History  ?Problem Relation Age of Onset  ? Alzheimer's disease Mother   ? Hypertension Father   ? Parkinson's disease Brother   ? Kidney cancer Maternal Grandfather   ? Pneumonia Paternal Grandmother   ? ? ?Social History  ? ?Tobacco  Use  ? Smoking status: Every Day  ?  Packs/day: 0.50  ?  Years: 47.00  ?  Pack years: 23.50  ?  Types: Cigarettes  ?  Start date: 02/24/1969  ? Smokeless tobacco: Never  ?Vaping Use  ? Vaping Use: Never used  ?Substance Use Topics  ? Alcohol use: No  ? Drug use: No  ? ? ?Medications: I have reviewed the patient's current medications. ?Prior to Admission:  ?Medications Prior to Admission  ?Medication Sig Dispense Refill Last Dose  ? acetaminophen (TYLENOL) 500 MG tablet Take 500 mg by mouth every 6 (six) hours as needed for moderate pain.   07/29/2021  ? calcium carbonate (TUMS - DOSED IN MG ELEMENTAL CALCIUM) 500 MG chewable tablet Chew 1 tablet by mouth as needed for indigestion or heartburn.   07/28/2021  ? lactobacillus acidophilus (BACID) TABS tablet Take 1 tablet by mouth daily.   07/28/2021  ? metoprolol tartrate (LOPRESSOR) 25 MG tablet Take 25 mg by mouth 2 (two) times daily.   07/29/2021 at 1000  ? warfarin (COUMADIN) 5 MG tablet Take 5 mg by mouth every evening.   2130  ? diltiazem (CARDIZEM) 30 MG tablet Take 1 tablet (30 mg total) by mouth as needed (palpitations). (Patient not taking: Reported on 07/29/2021) 30 tablet 3 Not Taking  ? ?Scheduled: ?  metoprolol tartrate  50 mg Oral BID  ? ?Continuous: ? 0.9 % NaCl with KCl 20 mEq / L 100 mL/hr at 07/30/21 1220  ? cefTRIAXone (ROCEPHIN)  IV    ? metronidazole 500 mg (07/30/21 1224)  ? ?[id number] **OR** acetaminophen, ondansetron **OR** ondansetron (ZOFRAN) IV, polyethylene glycol ? ?Allergies  ?Allergen Reactions  ? Banana Anaphylaxis  ? Hibiclens [Chlorhexidine Gluconate] Other (See Comments)  ?  Pt c/o burning and irritation when wiped with chlorhexidine wipes  ? ? ? ?ROS:  ?A comprehensive review of systems was negative except for: Gastrointestinal: positive for abdominal pain, nausea, and reflux symptoms ? ?Blood pressure (!) 109/92, pulse 91, temperature 97.6 ?F (36.4 ?C), temperature source Oral, resp. rate 17, height 5\' 8"  (1.727 m), weight 65.9 kg,  SpO2 96 %. ?Physical Exam ?Vitals reviewed.  ?HENT:  ?   Head: Normocephalic.  ?Eyes:  ?   Extraocular Movements: Extraocular movements intact.  ?Cardiovascular:  ?   Rate and Rhythm: Normal rate.  ?Pulmonary:  ?   Effort: Pulmonary effort is normal.  ?Abdominal:  ?   Palpations: Abdomen is soft.  ?   Tenderness: There is abdominal tenderness in the right upper quadrant, epigastric area and left upper quadrant. There is no guarding or rebound.  ?   Hernia: No hernia is present.  ?Musculoskeletal:  ?   Comments: Moves all extremities, no swelling   ?Skin: ?   General: Skin is warm.  ?Neurological:  ?   General: No focal deficit present.  ?   Mental Status: He is alert.  ?Psychiatric:     ?   Mood and Affect: Mood normal.     ?   Behavior: Behavior normal.  ? ? ?Results: ?Results for orders placed or performed during the hospital encounter of 07/29/21 (from the past 48 hour(s))  ?CBC with Differential     Status: None  ? Collection Time: 07/29/21  1:02 PM  ?Result Value Ref Range  ? WBC 5.4 4.0 - 10.5 K/uL  ? RBC 4.76 4.22 - 5.81 MIL/uL  ? Hemoglobin 14.7 13.0 - 17.0 g/dL  ? HCT 47.0 39.0 - 52.0 %  ? MCV 98.7 80.0 - 100.0 fL  ? MCH 30.9 26.0 - 34.0 pg  ? MCHC 31.3 30.0 - 36.0 g/dL  ? RDW 15.2 11.5 - 15.5 %  ? Platelets 198 150 - 400 K/uL  ? nRBC 0.0 0.0 - 0.2 %  ? Neutrophils Relative % 67 %  ? Neutro Abs 3.6 1.7 - 7.7 K/uL  ? Lymphocytes Relative 21 %  ? Lymphs Abs 1.1 0.7 - 4.0 K/uL  ? Monocytes Relative 10 %  ? Monocytes Absolute 0.5 0.1 - 1.0 K/uL  ? Eosinophils Relative 1 %  ? Eosinophils Absolute 0.1 0.0 - 0.5 K/uL  ? Basophils Relative 1 %  ? Basophils Absolute 0.1 0.0 - 0.1 K/uL  ? Immature Granulocytes 0 %  ? Abs Immature Granulocytes 0.02 0.00 - 0.07 K/uL  ?  Comment: Performed at [facility], [address] [place], [place] [address]  ?Comprehensive metabolic panel     Status: Abnormal  ? Collection Time: 07/29/21  1:02 PM  ?Result Value Ref Range  ? Sodium 140 135 - 145 mmol/L  ? Potassium 3.6 3.5 - 5.1  mmol/L  ? Chloride 106 98 - 111 mmol/L  ? CO2 25 22 - 32 mmol/L  ? Glucose, Bld 107 (H) 70 - 99 mg/dL  ?  Comment: Glucose reference range applies only to samples  taken after fasting for at least 8 hours.  ? BUN 13 8 - 23 mg/dL  ? Creatinine, Ser 1.16 0.61 - 1.24 mg/dL  ? Calcium 9.3 8.9 - 10.3 mg/dL  ? Total Protein 7.9 6.5 - 8.1 g/dL  ? Albumin 3.8 3.5 - 5.0 g/dL  ? AST 26 15 - 41 U/L  ? ALT 14 0 - 44 U/L  ? Alkaline Phosphatase 77 38 - 126 U/L  ? Total Bilirubin 2.2 (H) 0.3 - 1.2 mg/dL  ? GFR, Estimated >60 >60 mL/min  ?  Comment: (NOTE) ?Calculated using the CKD-EPI Creatinine Equation (2021) ?  ? Anion gap 9 5 - 15  ?  Comment: Performed at [facility], [address], [place], [place] [address]  ?Lipase, blood     Status: None  ? Collection Time: 07/29/21  1:02 PM  ?Result Value Ref Range  ? Lipase 38 11 - 51 U/L  ?  Comment: Performed at [facility], [address] [place], [place] [address]  ?Troponin I (High Sensitivity)     Status: Abnormal  ? Collection Time: 07/29/21  1:02 PM  ?Result Value Ref Range  ? Troponin I (High Sensitivity) 26 (H) <18 ng/L  ?  Comment: (NOTE) ?Elevated high sensitivity troponin I (hsTnI) values and significant  ?changes across serial measurements may suggest ACS but many other  ?chronic and acute conditions are known to elevate hsTnI results.  ?Refer to the "Links" section for chest pain algorithms and additional  ?guidance. ?Performed at [facility], [address] [place], [place] [address] ?  ?Protime-INR     Status: Abnormal  ? Collection Time: 07/29/21  1:02 PM  ?Result Value Ref Range  ? Prothrombin Time 24.2 (H) 11.4 - 15.2 seconds  ? INR 2.2 (H) 0.8 - 1.2  ?  Comment: (NOTE) ?INR goal varies based on device and disease states. ?Performed at [facility], [address], [place], [place] [address] ?  ?Troponin I (High Sensitivity)     Status: Abnormal  ? Collection Time: 07/29/21  3:23 PM  ?Result Value Ref Range  ? Troponin I (High Sensitivity) 21 (H) <18 ng/L  ?   Comment: (NOTE) ?Elevated high sensitivity troponin I (hsTnI) values and significant  ?changes across serial measurements may suggest ACS but many other  ?chronic and acute conditions are known to elevate hsTnI

## 2021-07-30 NOTE — TOC Progression Note (Signed)
?  Transition of Care (TOC) Screening Note ? ? ?Patient Details  ?Name: Cutberto Winfree ?Date of Birth: 23-Sep-1950 ? ? ?Transition of Care (TOC) CM/SW Contact:    ?Elliot Gault, LCSW ?Phone Number: ?07/30/2021, 11:22 AM ? ? ? ?Transition of Care Department [facility]) has reviewed patient and no TOC needs have been identified at this time. We will continue to monitor patient advancement through interdisciplinary progression rounds. If new patient transition needs arise, please place a TOC consult. ? ? ?

## 2021-07-30 NOTE — Plan of Care (Signed)
?  Problem: Activity: ?Goal: Risk for activity intolerance will decrease ?Outcome: Progressing ?  ?Problem: Skin Integrity: ?Goal: Risk for impaired skin integrity will decrease ?Outcome: Progressing ?  ?Problem: Health Behavior/Discharge Planning: ?Goal: Ability to safely manage health-related needs after discharge will improve ?Outcome: Progressing ?  ?
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
## 2021-07-30 NOTE — Assessment & Plan Note (Signed)
-  Cessation counseling provided -Nicotine patch ordered. 

## 2021-07-30 NOTE — Consult Note (Addendum)
?Cardiology Consultation:  ? ?Patient ID: Edward Young ?MRN: [id number]; DOB: 18-Jun-1950 ? ?Admit date: 07/29/2021 ?Date of Consult: 07/30/2021 ? ?PCP:  Rosalee Kaufman, PA-C ?  ?[facility] Providers ?Cardiologist:  [facility] Dr Candis Musa ?Click here to update MD or APP on Care Team, Refresh:1}   ? ? ?Patient Profile:  ? ?Edward Young is a 71 y.o. male with a hx of afib who is being seen 07/30/2021 for the evaluation of preoperative evaluation at the request of Dr Constance Haw. ? ?History of Present Illness:  ? ?Edward Young 71 yo male history of afib with prior ablation but recurrent afib, HTN, history of CVA presents with abdominal pain. Undergoing workup for posisble gallbladder disease. During admission found to be in afib with RVR. Cardiology consulted for afib management and preop evaluation for possible gall bladder surgery ? ?From cardiac standpoint denies any palpitaitns. No cardiac chest pains.  ? ? ? ?K 4.1 Cr 1.01 BUN 11 WBC 5.9 Hgb 13.8 Plt 185 INR 2.4 ?Trop 26-->21 ?CXR no acute process ?CT A/P: cholelithiasis ?EKG afib with RVR, nonspecific ST/T changes ? ?01/2020 [facility] Nuclear stress: no ischemia ?02/2019 echo: LVEF 60%, norml RV, mod LAE ? ?Past Medical History:  ?Diagnosis Date  ? Arthritis   ? GERD (gastroesophageal reflux disease)   ? PAF (paroxysmal atrial fibrillation) ([place])   ? Pneumonia 2016  ? Stroke [facility])   ? ? ?Past Surgical History:  ?Procedure Laterality Date  ? ATRIAL FIBRILLATION ABLATION N/A 11/20/2016  ? Procedure: Atrial Fibrillation Ablation;  Surgeon: Thompson Grayer, MD;  Location: [facility];  Service: Cardiovascular;  Laterality: N/A;  ? FRACTURE SURGERY    ? NASAL FRACTURE SURGERY  ~ 2003  ? "opened it up so I could breath; it had been broken ~ 3 times"  ?  ? ? ?Inpatient Medications: ?Scheduled Meds: ? metoprolol tartrate  50 mg Oral BID  ? phytonadione  2.5 mg Oral Once  ? ?Continuous Infusions: ? 0.9 % NaCl with KCl 20 mEq / L 100 mL/hr at 07/30/21 1220  ? cefTRIAXone  (ROCEPHIN)  IV    ? metronidazole 500 mg (07/30/21 1224)  ? ?PRN Meds: ?acetaminophen **OR** acetaminophen, ondansetron **OR** ondansetron (ZOFRAN) IV, polyethylene glycol ? ?Allergies:    ?Allergies  ?Allergen Reactions  ? Banana Anaphylaxis  ? Hibiclens [Chlorhexidine Gluconate] Other (See Comments)  ?  Pt c/o burning and irritation when wiped with chlorhexidine wipes  ? ? ?Social History:   ?Social History  ? ?Socioeconomic History  ? Marital status: Divorced  ?  Spouse name: Not on file  ? Number of children: Not on file  ? Years of education: Not on file  ? Highest education level: Not on file  ?Occupational History  ? Not on file  ?Tobacco Use  ? Smoking status: Every Day  ?  Packs/day: 0.50  ?  Years: 47.00  ?  Pack years: 23.50  ?  Types: Cigarettes  ?  Start date: 02/24/1969  ? Smokeless tobacco: Never  ?Vaping Use  ? Vaping Use: Never used  ?Substance and Sexual Activity  ? Alcohol use: No  ? Drug use: No  ? Sexual activity: Not on file  ?Other Topics Concern  ? Not on file  ?Social History Narrative  ? Lives in [place]  ? Retired from [employer]  ? ?Social Determinants of Health  ? ?Financial Resource Strain: Not on file  ?Food Insecurity: Not on file  ?Transportation Needs: Not on file  ?Physical Activity: Not  on file  ?Stress: Not on file  ?Social Connections: Not on file  ?Intimate Partner Violence: Not on file  ?  ?Family History:   ? ?Family History  ?Problem Relation Age of Onset  ? Alzheimer's disease Mother   ? Hypertension Father   ? Parkinson's disease Brother   ? Kidney cancer Maternal Grandfather   ? Pneumonia Paternal Grandmother   ?  ? ?ROS:  ?Please see the history of present illness.  ? ?All other ROS reviewed and negative.    ? ?Physical Exam/Data:  ? ?Vitals:  ? 07/30/21 1000 07/30/21 1100 07/30/21 1131 07/30/21 1200  ?BP: 109/82 116/86  (!) 109/92  ?Pulse: 98 (!) 41  91  ?Resp: 20 18  17   ?Temp:   97.6 ?F (36.4 ?C)   ?TempSrc:   Oral   ?SpO2: 95% 96%  96%  ?Weight:      ?Height:       ? ? ?Intake/Output Summary (Last 24 hours) at 07/30/2021 1345 ?Last data filed at 07/30/2021 1045 ?Gross per 24 hour  ?Intake 1032.09 ml  ?Output 375 ml  ?Net 657.09 ml  ? ? ?  07/30/2021  ? 12:19 AM 01/09/2021  ? 10:55 AM 12/05/2020  ?  2:11 PM  ?Last 3 Weights  ?Weight (lbs) 145 lb 4.5 oz 156 lb 156 lb  ?Weight (kg) 65.9 kg 70.761 kg 70.761 kg  ?   ?Body mass index is 22.09 kg/m?.  ?General:  Well nourished, well developed, in no acute distress ?HEENT: normal ?Neck: no JVD ?Vascular: No carotid bruits; Distal pulses 2+ bilaterally ?Cardiac:  irreg ?Lungs:  clear to auscultation bilaterally, no wheezing, rhonchi or rales  ?Abd: soft, nontender, no hepatomegaly  ?Ext: no edema ?Musculoskeletal:  No deformities, BUE and BLE strength normal and equal ?Skin: warm and dry  ?Neuro:  CNs 2-12 intact, no focal abnormalities noted ?Psych:  Normal affect  ? ? ? ?Laboratory Data: ? ?High Sensitivity Troponin:   ?Recent Labs  ?Lab 07/29/21 ?1302 07/29/21 ?1523  ?TROPONINIHS 26* 21*  ?   ?Chemistry ?Recent Labs  ?Lab 07/29/21 ?1302 07/30/21 ?0410  ?NA 140 138  ?K 3.6 4.1  ?CL 106 106  ?CO2 25 24  ?GLUCOSE 107* 94  ?BUN 13 11  ?CREATININE 1.16 1.01  ?CALCIUM 9.3 8.7*  ?GFRNONAA >60 >60  ?ANIONGAP 9 8  ?  ?Recent Labs  ?Lab 07/29/21 ?1302 07/30/21 ?0410  ?PROT 7.9 6.7  ?ALBUMIN 3.8 3.3*  ?AST 26 24  ?ALT 14 13  ?ALKPHOS 77 64  ?BILITOT 2.2* 2.0*  ? ?Lipids No results for input(s): CHOL, TRIG, HDL, LABVLDL, LDLCALC, CHOLHDL in the last 168 hours.  ?Hematology ?Recent Labs  ?Lab 07/29/21 ?1302 07/30/21 ?0410  ?WBC 5.4 5.9  ?RBC 4.76 4.35  ?HGB 14.7 13.8  ?HCT 47.0 43.0  ?MCV 98.7 98.9  ?MCH 30.9 31.7  ?MCHC 31.3 32.1  ?RDW 15.2 15.5  ?PLT 198 185  ? ?Thyroid No results for input(s): TSH, FREET4 in the last 168 hours.  ?BNPNo results for input(s): BNP, PROBNP in the last 168 hours.  ?DDimer No results for input(s): DDIMER in the last 168 hours. ? ? ?Radiology/Studies:  ?DG Chest 2 View ? ?Result Date: 07/29/2021 ?CLINICAL DATA:  Left-sided  chest pain EXAM: CHEST - 2 VIEW COMPARISON:  09/25/2020 FINDINGS: The heart size and mediastinal contours are within normal limits. Aortic atherosclerosis. Chronically coarsened interstitial markings bilaterally. No focal airspace consolidation, pleural effusion, or pneumothorax. The visualized skeletal structures are unremarkable. IMPRESSION: No  active cardiopulmonary disease. Electronically Signed   By: Davina Poke D.O.   On: 07/29/2021 14:16  ? ?CT Head Wo Contrast ? ?Result Date: 07/29/2021 ?CLINICAL DATA:  Weakness.  History of stroke. EXAM: CT HEAD WITHOUT CONTRAST TECHNIQUE: Contiguous axial images were obtained from the base of the skull through the vertex without intravenous contrast. RADIATION DOSE REDUCTION: This exam was performed according to the departmental dose-optimization program which includes automated exposure control, adjustment of the mA and/or kV according to patient size and/or use of iterative reconstruction technique. COMPARISON:  MRI brain dated March 14, 2019. CT head dated March 12, 2019. FINDINGS: Brain: No evidence of acute infarction, hemorrhage, hydrocephalus, extra-axial collection or mass lesion/mass effect. Mild generalized cerebral atrophy. Mild-to-moderate chronic microvascular ischemic changes. Vascular: Atherosclerotic vascular calcification of the carotid siphons. No hyperdense vessel. Skull: Normal. Negative for fracture or focal lesion. Sinuses/Orbits: Right maxillary sinus mucosal thickening with small air-fluid level. Remaining paranasal sinuses and mastoid air cells are clear. The orbits are unremarkable. Other: None. IMPRESSION: 1. No acute intracranial abnormality. 2. Right maxillary sinus disease with small air-fluid level. Correlate for acute sinusitis. Electronically Signed   By: Titus Dubin M.D.   On: 07/29/2021 16:44  ? ?CT ABDOMEN PELVIS W CONTRAST ? ?Result Date: 07/29/2021 ?CLINICAL DATA:  Nausea/vomiting Abdominal pain, acute, nonlocalized EXAM: CT  ABDOMEN AND PELVIS WITH CONTRAST TECHNIQUE: Multidetector CT imaging of the abdomen and pelvis was performed using the standard protocol following bolus administration of intravenous contrast. RADIATION DOS

## 2021-07-30 NOTE — Progress Notes (Signed)
?Progress Note ? ? ?Patient: Edward Young [id number] DOB: 01/31/1951 DOA: 07/29/2021     1 ?DOS: the patient was seen and examined on 07/30/2021 ?  ?Brief hospital course: ?As per H&P written by Dr. Denton Brick for 07/29/21 ?Keiontae Gaton is a 71 y.o. male with medical history significant for Atria fib on chronic anticoagulation with warfarin, tobacco abuse, stroke. ?Patient presented to the ED with complaints of need chest pain ongoing for the past 2 weeks to months.  Reports onset also of upper abdominal pain mid abdomen and left side.  Reports poor oral intake, and weakness.  Reports subjective fevers and chills.  No vomiting no loose stools. ?He describes chest pain "light" and "squeezing", and nonradiating.  Chest pains are not related to activity.  He reports he has had atrial fibrillation for about 20 years and has never had chest pains.  He denies palpitations.  No difficulty breathing.  He reports dizziness 2 days with resultant fall, but did not hit his head. ?  ?ED Course: Temperature 97.5.  Heart rate ranging from 39-158.  Respiratory rate 17-24.  Blood pressure systolic 0000000.  O2 sats 94 to 100% on room air. ?Abdominal CT shows cholelithiasis with multiple intraluminal stones.  No findings to suggest cholecystitis. ?Total bilirubin 2.2.  Lipase normal at 38. ?General surgeon Dr. Constance Haw was consulted, treating as cholecystitis for now, obtain ultrasound in the morning trend liver enzymes, hold Coumadin, check INR, n.p.o. midnight. ?10 mg Cardizem bolus given, with Cardizem drip, 1 L bolus fluids given, ceftriaxone started. ? ?Assessment and Plan: ?* Atrial fibrillation with rapid ventricular response ([place]) ?-History of atrial fibrillation on anticoagulation, also on metoprolol.  ?-Patient reported compliance with his medications.  Last warfarin dose was night prior to admission.   ?-INR is still therapeutic.   ?-Receive transient use of Cardizem drip and is now once again rate controlled; Cardizem drip has  been discontinue and the patient started on metoprolol 50 mg twice a day. ?-Patient reports no chest pain and echo in 2018 with preserved ejection fraction and no significant valvular disorder.   ?-Case has been discussed with cardiology service who has cleared patient for surgery ?-Coumadin on hold for anticipated surgical procedure on 07/31/2021 ?-Continue to follow clinical response.   ?-Will move patient to telemetry bed.  ? ?Esophageal reflux ?-Continue PPI. ? ?Tobacco abuse ?- Cessation counseling provided ?-Nicotine patch ordered. ? ?Cholelithiasis ?-Symptomatic with abdominal pain and nausea ?-On today's evaluation still having discomfort mid epigastric area and right upper quadrant on deep palpation.  ?-Patient reports symptoms are controlled with current analgesics. ?-Right upper quadrant ultrasound failed to further assist in evaluating his condition due to buildup of gas obscuring images.   ?-No signs of fever or elevated WBCs: ?-After discussing with general surgery will continue treatment with antibiotics, allow for clear liquid diet and plan for cholecystectomy on 07/31/2021 ?-Patient has been seen by cardiology and clear for surgical intervention.  ?-Stable LFTs, total bilirubin has come down from 2.2 on admission to 2.0 at this moment. ? ?Essential (primary) hypertension ?-Stable and well-controlled ?-Continue to follow vital sign ?-Heart healthy diet discussed with patient to be implemented once diet is resumed. ? ? ?Subjective:  ?Currently afebrile, no chest pain, reports no palpitations, nausea or vomiting.  Still complaining of midepigastric and right upper quadrant abdominal discomfort. ? ?Physical Exam: ?Vitals:  ? 07/30/21 1500 07/30/21 1600 07/30/21 1612 07/30/21 1700  ?BP: (!) 128/97 136/87  (!) 145/101  ?Pulse: 84 82  92  ?Resp: Marland Kitchen)  25 (!) 22  16  ?Temp:   (!) 96.4 ?F (35.8 ?C)   ?TempSrc:   Axillary   ?SpO2: 97% 95%  97%  ?Weight:      ?Height:      ? ?General exam: Alert, awake, oriented x  3; in no major distress. ?Respiratory system: Clear to auscultation. Respiratory effort normal.  Good saturation on room air. ?Cardiovascular system:Rate controlled, no rubs, no gallops, no JVD. ?Gastrointestinal system: Abdomen is nondistended, soft and tender to deep palpation in midepigastric area and right upper quadrant. No organomegaly or masses felt. Normal bowel sounds heard. ?Central nervous system: Alert and oriented. No focal neurological deficits. ?Extremities: No cyanosis or clubbing. ?Skin: No rashes, lesions or ulcers ?Psychiatry: Judgement and insight appear normal. Mood & affect appropriate.  ? ? ?Data Reviewed: ?Hemodynamically stable and off Cardizem drip at this point. ?CBC with WBC of 5.9, hemoglobin 13.8 and platelet counts 185 K ?INR 2.4 ?Comprehensive metabolic panel demonstrating sodium 138, potassium 4.1, BUN 11, bicarb 24, creatinine 1.01, normal LFTs and bilirubin has decreased from 2.2 at time of admission to 2.0 currently. ?MRSA PCR negative. ? ?Family Communication: Significant bedside. ? ?Disposition: ?Status is: Inpatient ?Remains inpatient appropriate because: Anticipated cholecystectomy on 07/31/21 ? ? Planned Discharge Destination: Home ? ? ?Author: ?Barton Dubois, MD ?07/30/2021 6:23 PM ? ?For on call review [email or web address].  ?

## 2021-07-30 NOTE — Assessment & Plan Note (Signed)
Continue PPI ?

## 2021-07-30 NOTE — Anesthesia Preprocedure Evaluation (Addendum)
Anesthesia Evaluation  ?Patient identified by MRN, date of birth, ID band ?Patient awake ? ? ? ?Reviewed: ?Allergy & Precautions, NPO status , Patient's Chart, lab work & pertinent test results, reviewed documented beta blocker date and time  ? ?Airway ?Mallampati: II ? ?TM Distance: >3 FB ?Neck ROM: Full ? ? ? Dental ? ?(+) Edentulous Upper, Edentulous Lower ?  ?Pulmonary ?pneumonia, resolved, Current Smoker and Patient abstained from smoking.,  ?  ?Pulmonary exam normal ?breath sounds clear to auscultation ? ? ? ? ? ? Cardiovascular ?Exercise Tolerance: Good ?hypertension, Pt. on medications and Pt. on home beta blockers ?+ dysrhythmias Atrial Fibrillation  ?Rhythm:Irregular Rate:Normal ? ? ?  ?Neuro/Psych ?CVA, Residual Symptoms negative psych ROS  ? GI/Hepatic ?Neg liver ROS, GERD  Medicated,  ?Endo/Other  ?negative endocrine ROS ? Renal/GU ?negative Renal ROS  ?negative genitourinary ?  ?Musculoskeletal ? ?(+) Arthritis , Osteoarthritis,   ? Abdominal ?  ?Peds ?negative pediatric ROS ?(+)  Hematology ?negative hematology ROS ?(+)   ?Anesthesia Other Findings ? ? Reproductive/Obstetrics ?negative OB ROS ? ?  ? ? ? ? ? ? ? ? ? ? ? ? ? ?  ?  ? ? ? ? ? ? ? ?Anesthesia Physical ?Anesthesia Plan ? ?ASA: 3 ? ?Anesthesia Plan: General  ? ?Post-op Pain Management: Dilaudid IV  ? ?Induction: Intravenous ? ?PONV Risk Score and Plan: 3 and Ondansetron and Dexamethasone ? ?Airway Management Planned: Oral ETT ? ?Additional Equipment:  ? ?Intra-op Plan:  ? ?Post-operative Plan: Extubation in OR ? ?Informed Consent: I have reviewed the patients History and Physical, chart, labs and discussed the procedure including the risks, benefits and alternatives for the proposed anesthesia with the patient or authorized representative who has indicated his/her understanding and acceptance.  ? ? ? ? ? ?Plan Discussed with: CRNA and Surgeon ? ?Anesthesia Plan Comments:   ? ? ? ? ? ?Anesthesia Quick  Evaluation ? ?

## 2021-07-30 NOTE — Hospital Course (Signed)
As per H&P written by Dr. Denton Brick for 07/29/21 ?Edward Young is a 71 y.o. male with medical history significant for Atria fib on chronic anticoagulation with warfarin, tobacco abuse, stroke. ?Patient presented to the ED with complaints of need chest pain ongoing for the past 2 weeks to months.  Reports onset also of upper abdominal pain mid abdomen and left side.  Reports poor oral intake, and weakness.  Reports subjective fevers and chills.  No vomiting no loose stools. ?He describes chest pain "light" and "squeezing", and nonradiating.  Chest pains are not related to activity.  He reports he has had atrial fibrillation for about 20 years and has never had chest pains.  He denies palpitations.  No difficulty breathing.  He reports dizziness 2 days with resultant fall, but did not hit his head. ?  ?ED Course: Temperature 97.5.  Heart rate ranging from 39-158.  Respiratory rate 17-24.  Blood pressure systolic 0000000.  O2 sats 94 to 100% on room air. ?Abdominal CT shows cholelithiasis with multiple intraluminal stones.  No findings to suggest cholecystitis. ?Total bilirubin 2.2.  Lipase normal at 38. ?General surgeon Dr. Constance Haw was consulted, treating as cholecystitis for now, obtain ultrasound in the morning trend liver enzymes, hold Coumadin, check INR, n.p.o. midnight. ?10 mg Cardizem bolus given, with Cardizem drip, 1 L bolus fluids given, ceftriaxone started. ?

## 2021-07-30 NOTE — Assessment & Plan Note (Signed)
-  Stable and well-controlled ?-Continue to follow vital sign ?-Heart healthy diet discussed with patient to be implemented once diet is resumed. ?

## 2021-07-31 DIAGNOSIS — K819 Cholecystitis, unspecified: Secondary | ICD-10-CM | POA: Diagnosis not present

## 2021-07-31 DIAGNOSIS — I4891 Unspecified atrial fibrillation: Secondary | ICD-10-CM | POA: Diagnosis not present

## 2021-07-31 DIAGNOSIS — K8000 Calculus of gallbladder with acute cholecystitis without obstruction: Secondary | ICD-10-CM | POA: Diagnosis not present

## 2021-07-31 LAB — PROTIME-INR
INR: 2.3 — ABNORMAL HIGH (ref 0.8–1.2)
INR: 2 — ABNORMAL HIGH (ref 0.8–1.2)
Prothrombin Time: 24.7 seconds — ABNORMAL HIGH (ref 11.4–15.2)
Prothrombin Time: 22.3 seconds — ABNORMAL HIGH (ref 11.4–15.2)

## 2021-07-31 LAB — CBC
HCT: 42.4 % (ref 39.0–52.0)
Hemoglobin: 13.9 g/dL (ref 13.0–17.0)
MCH: 32.3 pg (ref 26.0–34.0)
MCHC: 32.8 g/dL (ref 30.0–36.0)
MCV: 98.6 fL (ref 80.0–100.0)
Platelets: 186 10*3/uL (ref 150–400)
RBC: 4.3 MIL/uL (ref 4.22–5.81)
RDW: 15.7 % — ABNORMAL HIGH (ref 11.5–15.5)
WBC: 5.5 10*3/uL (ref 4.0–10.5)
nRBC: 0 % (ref 0.0–0.2)

## 2021-07-31 LAB — COMPREHENSIVE METABOLIC PANEL
ALT: 11 U/L (ref 0–44)
AST: 23 U/L (ref 15–41)
Albumin: 3.2 g/dL — ABNORMAL LOW (ref 3.5–5.0)
Alkaline Phosphatase: 61 U/L (ref 38–126)
Anion gap: 9 (ref 5–15)
BUN: 9 mg/dL (ref 8–23)
CO2: 21 mmol/L — ABNORMAL LOW (ref 22–32)
Calcium: 8.5 mg/dL — ABNORMAL LOW (ref 8.9–10.3)
Chloride: 107 mmol/L (ref 98–111)
Creatinine, Ser: 1.05 mg/dL (ref 0.61–1.24)
GFR, Estimated: >60 mL/min (ref >60)
Glucose, Bld: 96 mg/dL (ref 70–99)
Potassium: 3.7 mmol/L (ref 3.5–5.1)
Sodium: 137 mmol/L (ref 135–145)
Total Bilirubin: 2 mg/dL — ABNORMAL HIGH (ref 0.3–1.2)
Total Protein: 6.5 g/dL (ref 6.5–8.1)

## 2021-07-31 LAB — TYPE AND SCREEN
ABO/RH(D): A POS
Antibody Screen: NEGATIVE

## 2021-07-31 MED ORDER — DILTIAZEM HCL ER COATED BEADS 120 MG PO CP24
120.0000 mg | ORAL_CAPSULE | Freq: Every day | ORAL | Status: DC
  Administered 2021-07-31 – 2021-08-01 (×2): 120 mg via ORAL
  Filled 2021-07-31 (×2): qty 1

## 2021-07-31 MED ORDER — LEVALBUTEROL HCL 0.63 MG/3ML IN NEBU: 0.6300 mg | INHALATION_SOLUTION | Freq: Four times a day (QID) | RESPIRATORY_TRACT | Status: DC | PRN

## 2021-07-31 MED ORDER — MORPHINE SULFATE (PF) 2 MG/ML IV SOLN
2.0000 mg | INTRAVENOUS | Status: DC | PRN
  Administered 2021-07-31 – 2021-08-02 (×6): 2 mg via INTRAVENOUS
  Filled 2021-07-31 (×6): qty 1

## 2021-07-31 MED ORDER — DILTIAZEM HCL 25 MG/5ML IV SOLN
10.0000 mg | Freq: Once | INTRAVENOUS | Status: AC
  Administered 2021-07-31: 10 mg via INTRAVENOUS
  Filled 2021-07-31: qty 5

## 2021-07-31 MED ORDER — METOPROLOL TARTRATE 5 MG/5ML IV SOLN
5.0000 mg | INTRAVENOUS | Status: DC | PRN
Start: 2021-07-31 — End: 2021-08-02
  Filled 2021-07-31: qty 5

## 2021-07-31 MED ORDER — LIDOCAINE 5 % EX PTCH
1.0000 | MEDICATED_PATCH | CUTANEOUS | Status: DC
  Administered 2021-07-31: 1 via TRANSDERMAL
  Filled 2021-07-31 (×2): qty 1

## 2021-07-31 MED ORDER — OXYCODONE HCL 5 MG PO TABS: 5.0000 mg | ORAL_TABLET | ORAL | Status: DC | PRN

## 2021-07-31 MED ORDER — METOPROLOL SUCCINATE ER 50 MG PO TB24
50.0000 mg | ORAL_TABLET | Freq: Every day | ORAL | Status: DC
  Administered 2021-07-31 – 2021-08-02 (×3): 50 mg via ORAL
  Filled 2021-07-31 (×3): qty 1

## 2021-07-31 MED ORDER — METOPROLOL TARTRATE 5 MG/5ML IV SOLN
5.0000 mg | Freq: Once | INTRAVENOUS | Status: AC
  Administered 2021-07-31: 5 mg via INTRAVENOUS
  Filled 2021-07-31: qty 5

## 2021-07-31 MED ORDER — SODIUM CHLORIDE 0.9 % IV SOLN
2.0000 g | INTRAVENOUS | Status: AC
  Administered 2021-08-01: 2 g via INTRAVENOUS
  Filled 2021-07-31: qty 2

## 2021-07-31 MED ORDER — BUPIVACAINE HCL (PF) 0.5 % IJ SOLN
INTRAMUSCULAR | Status: AC
  Filled 2021-07-31: qty 30

## 2021-07-31 NOTE — Care Management Important Message (Signed)
Important Message ? ?Patient Details  ?Name: Edward Young ?MRN: [id number] ?Date of Birth: [birth year] ? ? ?Medicare Important Message Given:  Yes ? ? ? ? ?[name] ?07/31/2021, 11:47 AM ?

## 2021-07-31 NOTE — Progress Notes (Signed)
[facility] ? ?INR for 448 AM is 2.3, received Vit K orally at 1430 or so yesterday. Will repeat INR again closer to his case to see if it is down. If not will have to wait until tomorrow.  ? ?Keep NPO for now. Will determined plans for case once we see the repeat INR. I will place stat order when it is looking like we are getting close to his case. ? ?Algis Greenhouse, MD ?[facility] ?[address] ?[place], [place] [address] ?[phone] (office) ? ?

## 2021-07-31 NOTE — Progress Notes (Signed)
?Progress Note ? ? ?Patient: Edward Young [id number] DOB: January 12, 1951 DOA: 07/29/2021     2 ?DOS: the patient was seen and examined on 07/31/2021 ?  ?Brief hospital course: ?As per H&P written by Dr. Denton Brick for 07/29/21 ?Bradly Bozzuto is a 71 y.o. male with medical history significant for Atria fib on chronic anticoagulation with warfarin, tobacco abuse, stroke. ?Patient presented to the ED with complaints of need chest pain ongoing for the past 2 weeks to months.  Reports onset also of upper abdominal pain mid abdomen and left side.  Reports poor oral intake, and weakness.  Reports subjective fevers and chills.  No vomiting no loose stools. ?He describes chest pain "light" and "squeezing", and nonradiating.  Chest pains are not related to activity.  He reports he has had atrial fibrillation for about 20 years and has never had chest pains.  He denies palpitations.  No difficulty breathing.  He reports dizziness 2 days with resultant fall, but did not hit his head. ?  ?ED Course: Temperature 97.5.  Heart rate ranging from 39-158.  Respiratory rate 17-24.  Blood pressure systolic 0000000.  O2 sats 94 to 100% on room air. ?Abdominal CT shows cholelithiasis with multiple intraluminal stones.  No findings to suggest cholecystitis. ?Total bilirubin 2.2.  Lipase normal at 38. ?General surgeon Dr. Constance Haw was consulted, treating as cholecystitis for now, obtain ultrasound in the morning trend liver enzymes, hold Coumadin, check INR, n.p.o. midnight. ?10 mg Cardizem bolus given, with Cardizem drip, 1 L bolus fluids given, ceftriaxone started. ? ?Assessment and Plan: ?Atrial fibrillation with rapid ventricular response -- ?-History of atrial fibrillation on anticoagulation, also on metoprolol.  ?-Patient reported compliance with his medications.  Last warfarin dose was night prior to admission.   ?-Status post prior ablation but A-fib remains recurrent ?-Previous attempt for antiarrhythmic/rhythm control resulted in relative  bradycardia ?-Now having episodes of RVR ?-Cardiology consult/preop clearance appreciated ?-Metoprolol and Cardizem adjusted to try to improve rate control ?-Continue to hold Coumadin to allow for lap chole ?- echo in July 2018 with preserved ejection fraction and no significant valvular disorder.   ?- ? ?Esophageal reflux ?-Continue PPI. ? ?Tobacco abuse ?- Cessation counseling provided ?-Nicotine patch ordered. ? ?Cholelithiasis possible calculus cholecystitis ?-Symptomatic with abdominal pain and nausea ?-Patient general surgeon hold off on lap chole until INR dropped further down and rate control improves ? ?Essential (primary) hypertension ?-Monitor BP closely with adjustments of Cardizem and metoprolol  ? ?Subjective:  ?-Right upper quadrant pain persist ?-Complains of left-sided subcostal area discomfort now--no rash or other unusual findings ?=-Nausea persist ?-General surgeon Dr. Constance Haw at bedside ? ?Physical Exam: ?Vitals:  ? 07/31/21 0333 07/31/21 0427 07/31/21 0554 07/31/21 1240  ?BP: (!) 128/92  (!) 123/94 117/86  ?Pulse: 79  (!) 105 94  ?Resp: 17  16 18   ?Temp: 98.3 ?F (36.8 ?C)  (!) 97.5 ?F (36.4 ?C) 97.7 ?F (36.5 ?C)  ?TempSrc: Oral  Oral Oral  ?SpO2: 94% 93% 94% 93%  ?Weight:      ?Height:      ? ? ?Physical Exam ? ?Gen:- Awake Alert, in no acute distress  ?HEENT:- [place].AT, No sclera icterus ?Neck-Supple Neck,No JVD,.  ?Lungs-  CTAB , fair air movement bilaterally  ?CV- S1, S2 normal, RRR ?Abd-  +ve B.Sounds, Abd Soft, right upper quadrant tenderness without rebound or guarding  ?-Left-sided subcostal area pain without discomfort on palpation  ?extremity/Skin:- No  edema,   good pedal pulses  ?Psych-affect is appropriate, oriented x3 ?  Neuro-no new focal deficits, no tremors ? ? ?Lab Results  ?Component Value Date  ? WBC 5.5 07/31/2021  ? HGB 13.9 07/31/2021  ? HCT 42.4 07/31/2021  ? MCV 98.6 07/31/2021  ? PLT 186 07/31/2021  ? ?Lab Results  ?Component Value Date  ? NA 137 07/31/2021  ? K 3.7  07/31/2021  ? CO2 21 (L) 07/31/2021  ? GLUCOSE 96 07/31/2021  ? BUN 9 07/31/2021  ? CREATININE 1.05 07/31/2021  ? CALCIUM 8.5 (L) 07/31/2021  ? GFRNONAA >60 07/31/2021  ? ?Lab Results  ?Component Value Date  ? ALT 11 07/31/2021  ? AST 23 07/31/2021  ? ALKPHOS 61 07/31/2021  ? BILITOT 2.0 (H) 07/31/2021  ? ? ?Family Communication: None at bedside at this time ?Disposition: Home after lap chole ?Status is: Inpatient ?Remains inpatient appropriate because: Anticipated cholecystectomy on 08/01/21 ? ? Planned Discharge Destination: Home ? ? ?Author: ?Roxan Hockey, MD ?07/31/2021 3:23 PM ? ?For on call review [email or web address].  ?

## 2021-07-31 NOTE — Progress Notes (Signed)
[facility] Progress Note ? ?* Surgery Date in Future *  ?Subjective: ?INR not down yet. Will wait for tomorrorw. Left side pain around the rib cage. Has been going on for 2 weeks. No rash.  ? ?Objective: ?Vital signs in last 24 hours: ?Temp:  [96.4 ?F (35.8 ?C)-98.3 ?F (36.8 ?C)] 97.7 ?F (36.5 ?C) (04/05 1240) ?Pulse Rate:  [58-110] 94 (04/05 1240) ?Resp:  [11-25] 18 (04/05 1240) ?BP: (95-145)/(72-104) 117/86 (04/05 1240) ?SpO2:  [93 %-98 %] 93 % (04/05 1240) ?Last BM Date : 07/30/21 ? ?Intake/Output from previous day: ?04/04 0701 - 04/05 0700 ?In: 1468.1 [P.O.:240; I.V.:917.9; IV Piggyback:310.2] ?Out: 525 [Urine:525] ?Intake/Output this shift: ?No intake/output data recorded. ? ?General appearance: alert and no distress ?Resp: normal work of breathing ?GI: soft, nondistended, mild tenderness RUQ and epigastric region, LLQ chest area with tenderness with palpation over rib, no rash ? ?Lab Results:  ?Recent Labs  ?  07/30/21 ?0410 07/31/21 ?0448  ?WBC 5.9 5.5  ?HGB 13.8 13.9  ?HCT 43.0 42.4  ?PLT 185 186  ? ?BMET ?Recent Labs  ?  07/30/21 ?0410 07/31/21 ?0448  ?NA 138 137  ?K 4.1 3.7  ?CL 106 107  ?CO2 24 21*  ?GLUCOSE 94 96  ?BUN 11 9  ?CREATININE 1.01 1.05  ?CALCIUM 8.7* 8.5*  ? ?PT/INR ?Recent Labs  ?  07/31/21 ?0448 07/31/21 ?1037  ?LABPROT 24.7* 22.3*  ?INR 2.3* 2.0*  ? ?Reviewed imaging again- no pathology on the left side to explain the pain in that region  ?Studies/Results: ?DG Chest 2 View ? ?Result Date: 07/29/2021 ?CLINICAL DATA:  Left-sided chest pain EXAM: CHEST - 2 VIEW COMPARISON:  09/25/2020 FINDINGS: The heart size and mediastinal contours are within normal limits. Aortic atherosclerosis. Chronically coarsened interstitial markings bilaterally. No focal airspace consolidation, pleural effusion, or pneumothorax. The visualized skeletal structures are unremarkable. IMPRESSION: No active cardiopulmonary disease. Electronically Signed   By: Davina Poke D.O.   On: 07/29/2021 14:16   ? ?CT Head Wo Contrast ? ?Result Date: 07/29/2021 ?CLINICAL DATA:  Weakness.  History of stroke. EXAM: CT HEAD WITHOUT CONTRAST TECHNIQUE: Contiguous axial images were obtained from the base of the skull through the vertex without intravenous contrast. RADIATION DOSE REDUCTION: This exam was performed according to the departmental dose-optimization program which includes automated exposure control, adjustment of the mA and/or kV according to patient size and/or use of iterative reconstruction technique. COMPARISON:  MRI brain dated March 14, 2019. CT head dated March 12, 2019. FINDINGS: Brain: No evidence of acute infarction, hemorrhage, hydrocephalus, extra-axial collection or mass lesion/mass effect. Mild generalized cerebral atrophy. Mild-to-moderate chronic microvascular ischemic changes. Vascular: Atherosclerotic vascular calcification of the carotid siphons. No hyperdense vessel. Skull: Normal. Negative for fracture or focal lesion. Sinuses/Orbits: Right maxillary sinus mucosal thickening with small air-fluid level. Remaining paranasal sinuses and mastoid air cells are clear. The orbits are unremarkable. Other: None. IMPRESSION: 1. No acute intracranial abnormality. 2. Right maxillary sinus disease with small air-fluid level. Correlate for acute sinusitis. Electronically Signed   By: Titus Dubin M.D.   On: 07/29/2021 16:44  ? ?CT ABDOMEN PELVIS W CONTRAST ? ?Result Date: 07/29/2021 ?CLINICAL DATA:  Nausea/vomiting Abdominal pain, acute, nonlocalized EXAM: CT ABDOMEN AND PELVIS WITH CONTRAST TECHNIQUE: Multidetector CT imaging of the abdomen and pelvis was performed using the standard protocol following bolus administration of intravenous contrast. RADIATION DOSE REDUCTION: This exam was performed according to the departmental dose-optimization program which includes automated exposure control, adjustment of the mA and/or kV  according to patient size and/or use of iterative reconstruction technique.  CONTRAST:  112mL OMNIPAQUE IOHEXOL 300 MG/ML  SOLN COMPARISON:  CT 12/25/2020 FINDINGS: Lower chest: Small right pleural effusion with adjacent atelectasis. Cardiomegaly with dilated right atrium and right ventricle. Hepatobiliary: No focal liver abnormality is seen. Cholelithiasis with multiple intraluminal stones. The gallbladder appears nondilated and without adjacent inflammatory stranding. Pancreas: Unremarkable. No pancreatic ductal dilatation or surrounding inflammatory changes. Spleen: Normal in size without focal abnormality. Adrenals/Urinary Tract: Adrenal glands are unremarkable. No hydronephrosis or nephrolithiasis. The bladder is moderately distended but unremarkable. Stomach/Bowel: The stomach is within normal limits. There is no evidence of bowel obstruction.The appendix is normal. Sigmoid diverticulosis. No acute diverticulitis. Vascular/Lymphatic: Aortoiliac atherosclerotic calcifications. No AAA. No lymphadenopathy. Reproductive: Unremarkable. Other: There are bilateral inguinal hernias containing nonobstructed bowel. Mild generalized intra-abdominal and body wall edema. Musculoskeletal: No acute osseous abnormality. No suspicious lytic or blastic lesions. Multilevel degenerative changes of the spine. 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 IMPRESSION: Cholelithiasis with multiple intraluminal stones. No other CT findings to suggest cholecystitis. Sigmoid diverticulosis without evidence of acute diverticulitis. Bowel containing bilateral inguinal hernias. No evidence of bowel obstruction. Normal appendix. Small right pleural effusion with adjacent atelectasis. Cardiomegaly. Electronically Signed   By: Maurine Simmering M.D.   On: 07/29/2021 16:57  ? ?US Abdomen Limited RUQ (LIVER/GB) ? ?Result Date: 07/30/2021 ?CLINICAL DATA:  Cholelithiasis. EXAM: ULTRASOUND ABDOMEN LIMITED RIGHT UPPER QUADRANT COMPARISON:  CT of the abdomen on 07/29/2021 FINDINGS: Gallbladder: Despite change in patient positioning and  utilizing multiple windows, the gallbladder is not visualized by ultrasound. This can be explained by CT appearance with colon and small bowel draped over the region of the gallbladder and anterior liver. Common bile duct: Diameter: 3 mm Liver: No focal lesion identified. Within normal limits in parenchymal echogenicity. Portal vein is patent on color Doppler imaging with normal direction of blood flow towards the liver. Other: Small right pleural effusion. IMPRESSION: The gallbladder cannot be identified by ultrasound due to overlying bowel gas. This is explained on the CT by multiple bowel loops overlying the region of the gallbladder and anterior liver. No evidence of biliary ductal dilatation. Electronically Signed   By: Aletta Edouard M.D.   On: 07/30/2021 10:50   ? ?Anti-infectives: ?Anti-infectives (From admission, onward)  ? ? Start     Dose/Rate Route Frequency Ordered Stop  ? 08/01/21 0600  cefoTEtan (CEFOTAN) 2 g in sodium chloride 0.9 % 100 mL IVPB       ? 2 g ?200 mL/hr over 30 Minutes Intravenous On call to O.R. 07/31/21 1127 08/02/21 0559  ? 07/31/21 0600  cefoTEtan (CEFOTAN) 2 g in sodium chloride 0.9 % 100 mL IVPB  Status:  Discontinued       ? 2 g ?200 mL/hr over 30 Minutes Intravenous On call to O.R. 07/30/21 2125 07/31/21 1126  ? 07/30/21 1800  cefTRIAXone (ROCEPHIN) 2 g in sodium chloride 0.9 % 100 mL IVPB       ? 2 g ?200 mL/hr over 30 Minutes Intravenous Every 24 hours 07/30/21 0000    ? 07/30/21 0015  cefTRIAXone (ROCEPHIN) 1 g in sodium chloride 0.9 % 100 mL IVPB       ? 1 g ?200 mL/hr over 30 Minutes Intravenous  Once 07/30/21 0000 07/30/21 0112  ? 07/30/21 0000  metroNIDAZOLE (FLAGYL) IVPB 500 mg       ? 500 mg ?100 mL/hr over 60 Minutes Intravenous Every 12 hours 07/30/21 0000    ? 07/29/21  1815  cefTRIAXone (ROCEPHIN) 1 g in sodium chloride 0.9 % 100 mL IVPB       ? 1 g ?200 mL/hr over 30 Minutes Intravenous  Once 07/29/21 1807 07/29/21 1926  ? ?  ? ? ?Assessment/Plan: ?Patient with  gallstones and likely some degree of cholecystitis. Has been having issues sine August, gallbladder full of stones. INR still up. Plan for tomorrow. INR in AM. Soft diet until midnight ?NPO midnight ?Patient un

## 2021-07-31 NOTE — Progress Notes (Addendum)
2135 Patient arrived from ICU with HRs 110s. ? ?2326 Patient with new exp. wheezing. Zierle-Ghosh MD notified, new orders placed. Breathing treatment administered. ? ?0150 MD Zierle-Ghosh notified of increase in frequency of HR elevated into the 130s-150s. Patient denies anxiety and is resting comfortably in bed. New order placed for IV metoprolol. ? ?0221 IV metop administered. ? ?0345 MD Zierle-Ghosh contacted regarding the increase in frequency of HR elevated into the 120s-150s. New order placed for IV cardizem.  ? ?0409 IV cardizem administered. Prior to medicine administration patient c/o feeling short of breath, "feels like I can't get a good breath in". Denies being anxious, states he is tired and feels like he has "been running."  ? ?0420 Respiratory therapy contacted for breathing treatment.  ? ? ?

## 2021-07-31 NOTE — Progress Notes (Signed)
EKG completed: Afib with RVR. ? ?Message sent to MD Mariea Clonts + MD Ziele-Ghosh. ? ?Will pass along to day shift RN in report. ?

## 2021-07-31 NOTE — H&P (View-Only) (Signed)
[facility] Progress Note ? ?* Surgery Date in Future *  ?Subjective: ?INR not down yet. Will wait for tomorrorw. Left side pain around the rib cage. Has been going on for 2 weeks. No rash.  ? ?Objective: ?Vital signs in last 24 hours: ?Temp:  [96.4 ?F (35.8 ?C)-98.3 ?F (36.8 ?C)] 97.7 ?F (36.5 ?C) (04/05 1240) ?Pulse Rate:  [58-110] 94 (04/05 1240) ?Resp:  [11-25] 18 (04/05 1240) ?BP: (95-145)/(72-104) 117/86 (04/05 1240) ?SpO2:  [93 %-98 %] 93 % (04/05 1240) ?Last BM Date : 07/30/21 ? ?Intake/Output from previous day: ?04/04 0701 - 04/05 0700 ?In: 1468.1 [P.O.:240; I.V.:917.9; IV Piggyback:310.2] ?Out: 525 [Urine:525] ?Intake/Output this shift: ?No intake/output data recorded. ? ?General appearance: alert and no distress ?Resp: normal work of breathing ?GI: soft, nondistended, mild tenderness RUQ and epigastric region, LLQ chest area with tenderness with palpation over rib, no rash ? ?Lab Results:  ?Recent Labs  ?  07/30/21 ?0410 07/31/21 ?0448  ?WBC 5.9 5.5  ?HGB 13.8 13.9  ?HCT 43.0 42.4  ?PLT 185 186  ? ?BMET ?Recent Labs  ?  07/30/21 ?0410 07/31/21 ?0448  ?NA 138 137  ?K 4.1 3.7  ?CL 106 107  ?CO2 24 21*  ?GLUCOSE 94 96  ?BUN 11 9  ?CREATININE 1.01 1.05  ?CALCIUM 8.7* 8.5*  ? ?PT/INR ?Recent Labs  ?  07/31/21 ?0448 07/31/21 ?1037  ?LABPROT 24.7* 22.3*  ?INR 2.3* 2.0*  ? ?Reviewed imaging again- no pathology on the left side to explain the pain in that region  ?Studies/Results: ?DG Chest 2 View ? ?Result Date: 07/29/2021 ?CLINICAL DATA:  Left-sided chest pain EXAM: CHEST - 2 VIEW COMPARISON:  09/25/2020 FINDINGS: The heart size and mediastinal contours are within normal limits. Aortic atherosclerosis. Chronically coarsened interstitial markings bilaterally. No focal airspace consolidation, pleural effusion, or pneumothorax. The visualized skeletal structures are unremarkable. IMPRESSION: No active cardiopulmonary disease. Electronically Signed   By: Nicholas  Plundo D.O.   On: 07/29/2021 14:16   ? ?CT Head Wo Contrast ? ?Result Date: 07/29/2021 ?CLINICAL DATA:  Weakness.  History of stroke. EXAM: CT HEAD WITHOUT CONTRAST TECHNIQUE: Contiguous axial images were obtained from the base of the skull through the vertex without intravenous contrast. RADIATION DOSE REDUCTION: This exam was performed according to the departmental dose-optimization program which includes automated exposure control, adjustment of the mA and/or kV according to patient size and/or use of iterative reconstruction technique. COMPARISON:  MRI brain dated March 14, 2019. CT head dated March 12, 2019. FINDINGS: Brain: No evidence of acute infarction, hemorrhage, hydrocephalus, extra-axial collection or mass lesion/mass effect. Mild generalized cerebral atrophy. Mild-to-moderate chronic microvascular ischemic changes. Vascular: Atherosclerotic vascular calcification of the carotid siphons. No hyperdense vessel. Skull: Normal. Negative for fracture or focal lesion. Sinuses/Orbits: Right maxillary sinus mucosal thickening with small air-fluid level. Remaining paranasal sinuses and mastoid air cells are clear. The orbits are unremarkable. Other: None. IMPRESSION: 1. No acute intracranial abnormality. 2. Right maxillary sinus disease with small air-fluid level. Correlate for acute sinusitis. Electronically Signed   By: William T Derry M.D.   On: 07/29/2021 16:44  ? ?CT ABDOMEN PELVIS W CONTRAST ? ?Result Date: 07/29/2021 ?CLINICAL DATA:  Nausea/vomiting Abdominal pain, acute, nonlocalized EXAM: CT ABDOMEN AND PELVIS WITH CONTRAST TECHNIQUE: Multidetector CT imaging of the abdomen and pelvis was performed using the standard protocol following bolus administration of intravenous contrast. RADIATION DOSE REDUCTION: This exam was performed according to the departmental dose-optimization program which includes automated exposure control, adjustment of the mA and/or kV   according to patient size and/or use of iterative reconstruction technique.  CONTRAST:  100mL OMNIPAQUE IOHEXOL 300 MG/ML  SOLN COMPARISON:  CT 12/25/2020 FINDINGS: Lower chest: Small right pleural effusion with adjacent atelectasis. Cardiomegaly with dilated right atrium and right ventricle. Hepatobiliary: No focal liver abnormality is seen. Cholelithiasis with multiple intraluminal stones. The gallbladder appears nondilated and without adjacent inflammatory stranding. Pancreas: Unremarkable. No pancreatic ductal dilatation or surrounding inflammatory changes. Spleen: Normal in size without focal abnormality. Adrenals/Urinary Tract: Adrenal glands are unremarkable. No hydronephrosis or nephrolithiasis. The bladder is moderately distended but unremarkable. Stomach/Bowel: The stomach is within normal limits. There is no evidence of bowel obstruction.The appendix is normal. Sigmoid diverticulosis. No acute diverticulitis. Vascular/Lymphatic: Aortoiliac atherosclerotic calcifications. No AAA. No lymphadenopathy. Reproductive: Unremarkable. Other: There are bilateral inguinal hernias containing nonobstructed bowel. Mild generalized intra-abdominal and body wall edema. Musculoskeletal: No acute osseous abnormality. No suspicious lytic or blastic lesions. Multilevel degenerative changes of the spine. 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 IMPRESSION: Cholelithiasis with multiple intraluminal stones. No other CT findings to suggest cholecystitis. Sigmoid diverticulosis without evidence of acute diverticulitis. Bowel containing bilateral inguinal hernias. No evidence of bowel obstruction. Normal appendix. Small right pleural effusion with adjacent atelectasis. Cardiomegaly. Electronically Signed   By: Jacob  Kahn M.D.   On: 07/29/2021 16:57  ? ?US Abdomen Limited RUQ (LIVER/GB) ? ?Result Date: 07/30/2021 ?CLINICAL DATA:  Cholelithiasis. EXAM: ULTRASOUND ABDOMEN LIMITED RIGHT UPPER QUADRANT COMPARISON:  CT of the abdomen on 07/29/2021 FINDINGS: Gallbladder: Despite change in patient positioning and  utilizing multiple windows, the gallbladder is not visualized by ultrasound. This can be explained by CT appearance with colon and small bowel draped over the region of the gallbladder and anterior liver. Common bile duct: Diameter: 3 mm Liver: No focal lesion identified. Within normal limits in parenchymal echogenicity. Portal vein is patent on color Doppler imaging with normal direction of blood flow towards the liver. Other: Small right pleural effusion. IMPRESSION: The gallbladder cannot be identified by ultrasound due to overlying bowel gas. This is explained on the CT by multiple bowel loops overlying the region of the gallbladder and anterior liver. No evidence of biliary ductal dilatation. Electronically Signed   By: Glenn  Yamagata M.D.   On: 07/30/2021 10:50   ? ?Anti-infectives: ?Anti-infectives (From admission, onward)  ? ? Start     Dose/Rate Route Frequency Ordered Stop  ? 08/01/21 0600  cefoTEtan (CEFOTAN) 2 g in sodium chloride 0.9 % 100 mL IVPB       ? 2 g ?200 mL/hr over 30 Minutes Intravenous On call to O.R. 07/31/21 1127 08/02/21 0559  ? 07/31/21 0600  cefoTEtan (CEFOTAN) 2 g in sodium chloride 0.9 % 100 mL IVPB  Status:  Discontinued       ? 2 g ?200 mL/hr over 30 Minutes Intravenous On call to O.R. 07/30/21 2125 07/31/21 1126  ? 07/30/21 1800  cefTRIAXone (ROCEPHIN) 2 g in sodium chloride 0.9 % 100 mL IVPB       ? 2 g ?200 mL/hr over 30 Minutes Intravenous Every 24 hours 07/30/21 0000    ? 07/30/21 0015  cefTRIAXone (ROCEPHIN) 1 g in sodium chloride 0.9 % 100 mL IVPB       ? 1 g ?200 mL/hr over 30 Minutes Intravenous  Once 07/30/21 0000 07/30/21 0112  ? 07/30/21 0000  metroNIDAZOLE (FLAGYL) IVPB 500 mg       ? 500 mg ?100 mL/hr over 60 Minutes Intravenous Every 12 hours 07/30/21 0000    ? 07/29/21   1815  cefTRIAXone (ROCEPHIN) 1 g in sodium chloride 0.9 % 100 mL IVPB       ? 1 g ?200 mL/hr over 30 Minutes Intravenous  Once 07/29/21 1807 07/29/21 1926  ? ?  ? ? ?Assessment/Plan: ?Patient with  gallstones and likely some degree of cholecystitis. Has been having issues sine August, gallbladder full of stones. INR still up. Plan for tomorrow. INR in AM. Soft diet until midnight ?NPO midnight ?Patient un

## 2021-07-31 NOTE — Progress Notes (Signed)
Patient complained of irritability and discomfort/pain this morning. HR was between 110-140. Patient was given PO metoprolol, IV Cardizem and morphine. He was assisted in repositioning in the bed, he finally got comfortable and was able to rest. Patient has been ambulating to the restroom with standby assist. ?

## 2021-08-01 ENCOUNTER — Encounter (HOSPITAL_COMMUNITY)
Admission: EM | Disposition: A | Discharge status: Home / Self Care | Payer: Self-pay | Source: Home / Self Care | Attending: Family Medicine

## 2021-08-01 ENCOUNTER — Inpatient Hospital Stay (HOSPITAL_COMMUNITY): Payer: Medicare Other | Admitting: Anesthesiology

## 2021-08-01 ENCOUNTER — Encounter (HOSPITAL_COMMUNITY): Payer: Self-pay | Admitting: Internal Medicine

## 2021-08-01 ENCOUNTER — Other Ambulatory Visit: Payer: Self-pay

## 2021-08-01 DIAGNOSIS — K8000 Calculus of gallbladder with acute cholecystitis without obstruction: Secondary | ICD-10-CM | POA: Diagnosis not present

## 2021-08-01 DIAGNOSIS — I4891 Unspecified atrial fibrillation: Secondary | ICD-10-CM | POA: Diagnosis not present

## 2021-08-01 DIAGNOSIS — I10 Essential (primary) hypertension: Secondary | ICD-10-CM

## 2021-08-01 DIAGNOSIS — K8070 Calculus of gallbladder and bile duct without cholecystitis without obstruction: Secondary | ICD-10-CM

## 2021-08-01 DIAGNOSIS — Z8673 Personal history of transient ischemic attack (TIA), and cerebral infarction without residual deficits: Secondary | ICD-10-CM

## 2021-08-01 HISTORY — PX: CHOLECYSTECTOMY: SHX55

## 2021-08-01 LAB — PROTIME-INR
INR: 1.7 — ABNORMAL HIGH (ref 0.8–1.2)
INR: 1.7 — ABNORMAL HIGH (ref 0.8–1.2)
Prothrombin Time: 20 seconds — ABNORMAL HIGH (ref 11.4–15.2)
Prothrombin Time: 20.1 seconds — ABNORMAL HIGH (ref 11.4–15.2)

## 2021-08-01 SURGERY — LAPAROSCOPIC CHOLECYSTECTOMY
Anesthesia: General | Site: Abdomen

## 2021-08-01 SURGERY — LAPAROSCOPIC CHOLECYSTECTOMY
Anesthesia: General

## 2021-08-01 MED ORDER — LIDOCAINE HCL (CARDIAC) PF 100 MG/5ML IV SOSY
PREFILLED_SYRINGE | INTRAVENOUS | Status: DC | PRN
  Administered 2021-08-01: 30 mg via INTRAVENOUS

## 2021-08-01 MED ORDER — SIMETHICONE 80 MG PO CHEW: 40.0000 mg | CHEWABLE_TABLET | Freq: Four times a day (QID) | ORAL | Status: DC | PRN

## 2021-08-01 MED ORDER — BUPIVACAINE HCL (PF) 0.5 % IJ SOLN
INTRAMUSCULAR | Status: AC
  Filled 2021-08-01: qty 30

## 2021-08-01 MED ORDER — DEXAMETHASONE SODIUM PHOSPHATE 4 MG/ML IJ SOLN
INTRAMUSCULAR | Status: DC | PRN
  Administered 2021-08-01: 4 mg via INTRAVENOUS

## 2021-08-01 MED ORDER — HYDROMORPHONE HCL 1 MG/ML IJ SOLN
0.2500 mg | INTRAMUSCULAR | Status: DC | PRN
  Administered 2021-08-01: 0.5 mg via INTRAVENOUS
  Filled 2021-08-01: qty 0.5

## 2021-08-01 MED ORDER — FENTANYL CITRATE (PF) 100 MCG/2ML IJ SOLN
INTRAMUSCULAR | Status: AC
  Filled 2021-08-01: qty 2

## 2021-08-01 MED ORDER — FENTANYL CITRATE (PF) 100 MCG/2ML IJ SOLN
INTRAMUSCULAR | Status: DC | PRN
  Administered 2021-08-01: 25 ug via INTRAVENOUS
  Administered 2021-08-01: 50 ug via INTRAVENOUS

## 2021-08-01 MED ORDER — SUGAMMADEX SODIUM 200 MG/2ML IV SOLN
INTRAVENOUS | Status: DC | PRN
  Administered 2021-08-01: 200 mg via INTRAVENOUS

## 2021-08-01 MED ORDER — DEXAMETHASONE SODIUM PHOSPHATE 10 MG/ML IJ SOLN
INTRAMUSCULAR | Status: AC
Start: 2021-08-01 — End: ?
  Filled 2021-08-01: qty 1

## 2021-08-01 MED ORDER — HEMOSTATIC AGENTS (NO CHARGE) OPTIME
TOPICAL | Status: DC | PRN
  Administered 2021-08-01 (×2): 1 via TOPICAL

## 2021-08-01 MED ORDER — BUPIVACAINE LIPOSOME 1.3 % IJ SUSP
INTRAMUSCULAR | Status: DC | PRN
  Administered 2021-08-01: 20 mL

## 2021-08-01 MED ORDER — LACTATED RINGERS IV SOLN
INTRAVENOUS | Status: DC
  Administered 2021-08-01: 1000 mL via INTRAVENOUS

## 2021-08-01 MED ORDER — ONDANSETRON HCL 4 MG/2ML IJ SOLN
INTRAMUSCULAR | Status: AC
  Filled 2021-08-01: qty 2

## 2021-08-01 MED ORDER — ETOMIDATE 2 MG/ML IV SOLN
INTRAVENOUS | Status: AC
Start: 2021-08-01 — End: ?
  Filled 2021-08-01: qty 10

## 2021-08-01 MED ORDER — ROCURONIUM BROMIDE 10 MG/ML (PF) SYRINGE
PREFILLED_SYRINGE | INTRAVENOUS | Status: AC
  Filled 2021-08-01: qty 10

## 2021-08-01 MED ORDER — PHENYLEPHRINE HCL (PRESSORS) 10 MG/ML IV SOLN
INTRAVENOUS | Status: AC
  Filled 2021-08-01: qty 1

## 2021-08-01 MED ORDER — SODIUM CHLORIDE 0.9 % IR SOLN
Status: DC | PRN
  Administered 2021-08-01: 1000 mL

## 2021-08-01 MED ORDER — ONDANSETRON HCL 4 MG/2ML IJ SOLN
INTRAMUSCULAR | Status: DC | PRN
  Administered 2021-08-01: 4 mg via INTRAVENOUS

## 2021-08-01 MED ORDER — SUGAMMADEX SODIUM 500 MG/5ML IV SOLN
INTRAVENOUS | Status: AC
  Filled 2021-08-01: qty 5

## 2021-08-01 MED ORDER — MIDAZOLAM HCL 2 MG/2ML IJ SOLN
INTRAMUSCULAR | Status: AC
  Filled 2021-08-01: qty 2

## 2021-08-01 MED ORDER — SODIUM CHLORIDE 0.9 % IV SOLN: INTRAVENOUS | Status: DC

## 2021-08-01 MED ORDER — BUPIVACAINE LIPOSOME 1.3 % IJ SUSP
INTRAMUSCULAR | Status: AC
Start: 2021-08-01 — End: ?
  Filled 2021-08-01: qty 20

## 2021-08-01 MED ORDER — PHENYLEPHRINE 40 MCG/ML (10ML) SYRINGE FOR IV PUSH (FOR BLOOD PRESSURE SUPPORT)
PREFILLED_SYRINGE | INTRAVENOUS | Status: DC | PRN
  Administered 2021-08-01: 80 ug via INTRAVENOUS
  Administered 2021-08-01: 40 ug via INTRAVENOUS
  Administered 2021-08-01 (×2): 80 ug via INTRAVENOUS

## 2021-08-01 MED ORDER — ROCURONIUM BROMIDE 10 MG/ML (PF) SYRINGE
PREFILLED_SYRINGE | INTRAVENOUS | Status: DC | PRN
  Administered 2021-08-01: 50 mg via INTRAVENOUS

## 2021-08-01 MED ORDER — PROPOFOL 10 MG/ML IV BOLUS
INTRAVENOUS | Status: DC | PRN
  Administered 2021-08-01: 100 mg via INTRAVENOUS
  Administered 2021-08-01: 50 mg via INTRAVENOUS

## 2021-08-01 SURGICAL SUPPLY — 50 items
ADH SKN CLS APL DERMABOND .7 (GAUZE/BANDAGES/DRESSINGS) ×1
APL ESCP 73.6OZ SRGCL (TIP) ×1
APL PRP STRL LF DISP 70% ISPRP (MISCELLANEOUS) ×1
APL SRG 38 LTWT LNG FL B (MISCELLANEOUS)
APPLICATOR ARISTA FLEXITIP XL (MISCELLANEOUS) IMPLANT
APPLIER CLIP ROT 10 11.4 M/L (STAPLE) ×2
APR CLP MED LRG 11.4X10 (STAPLE) ×1
BAG RETRIEVAL 10 (BASKET) ×1
CHLORAPREP W/TINT 26 (MISCELLANEOUS) ×2 IMPLANT
CLIP APPLIE ROT 10 11.4 M/L (STAPLE) ×1 IMPLANT
CLOTH BEACON ORANGE TIMEOUT ST (SAFETY) ×2 IMPLANT
COVER LIGHT HANDLE STERIS (MISCELLANEOUS) ×4 IMPLANT
DERMABOND ADVANCED (GAUZE/BANDAGES/DRESSINGS) ×1
DERMABOND ADVANCED .7 DNX12 (GAUZE/BANDAGES/DRESSINGS) ×1 IMPLANT
ELECT REM PT RETURN 9FT ADLT (ELECTROSURGICAL) ×2
ELECTRODE REM PT RTRN 9FT ADLT (ELECTROSURGICAL) ×1 IMPLANT
GAUZE 4X4 16PLY ~~LOC~~+RFID DBL (SPONGE) IMPLANT
GLOVE SURG POLYISO LF SZ7.5 (GLOVE) ×2 IMPLANT
GLOVE SURG SS PI 7.5 STRL IVOR (GLOVE) ×1 IMPLANT
GLOVE SURG UNDER POLY LF SZ7 (GLOVE) ×4 IMPLANT
GOWN STRL REUS W/TWL LRG LVL3 (GOWN DISPOSABLE) ×6 IMPLANT
HEMOSTAT SNOW SURGICEL 2X4 (HEMOSTASIS) ×2 IMPLANT
INST SET LAPROSCOPIC AP (KITS) ×2 IMPLANT
KIT TURNOVER KIT A (KITS) ×2 IMPLANT
MANIFOLD NEPTUNE II (INSTRUMENTS) ×2 IMPLANT
NDL HYPO 18GX1.5 BLUNT FILL (NEEDLE) ×1 IMPLANT
NDL HYPO 21X1.5 SAFETY (NEEDLE) ×1 IMPLANT
NDL INSUFFLATION 14GA 120MM (NEEDLE) ×1 IMPLANT
NEEDLE HYPO 18GX1.5 BLUNT FILL (NEEDLE) ×2 IMPLANT
NEEDLE HYPO 21X1.5 SAFETY (NEEDLE) ×2 IMPLANT
NEEDLE INSUFFLATION 14GA 120MM (NEEDLE) ×2 IMPLANT
NS IRRIG 1000ML POUR BTL (IV SOLUTION) ×2 IMPLANT
PACK LAP CHOLE LZT030E (CUSTOM PROCEDURE TRAY) ×2 IMPLANT
PAD ARMBOARD 7.5X6 YLW CONV (MISCELLANEOUS) ×2 IMPLANT
PENCIL SMOKE EVACUATOR (MISCELLANEOUS) ×1 IMPLANT
POWDER SURGICEL 3.0 GRAM (HEMOSTASIS) ×1 IMPLANT
SET BASIN LINEN APH (SET/KITS/TRAYS/PACK) ×2 IMPLANT
SET TUBE SMOKE EVAC HIGH FLOW (TUBING) ×2 IMPLANT
SLEEVE ENDOPATH XCEL 5M (ENDOMECHANICALS) ×2 IMPLANT
SUT MNCRL AB 4-0 PS2 18 (SUTURE) ×4 IMPLANT
SUT VICRYL 0 UR6 27IN ABS (SUTURE) ×2 IMPLANT
SYR 20ML LL LF (SYRINGE) ×3 IMPLANT
SYS BAG RETRIEVAL 10MM (BASKET) ×1
SYSTEM BAG RETRIEVAL 10MM (BASKET) ×1 IMPLANT
TIP ENDOSCOPIC SURGICEL (TIP) ×1 IMPLANT
TROCAR ENDO BLADELESS 11MM (ENDOMECHANICALS) ×2 IMPLANT
TROCAR XCEL NON-BLD 5MMX100MML (ENDOMECHANICALS) ×2 IMPLANT
TROCAR XCEL UNIV SLVE 11M 100M (ENDOMECHANICALS) ×2 IMPLANT
TUBE CONNECTING 12X1/4 (SUCTIONS) ×2 IMPLANT
WARMER LAPAROSCOPE (MISCELLANEOUS) ×2 IMPLANT

## 2021-08-01 NOTE — Anesthesia Procedure Notes (Signed)
Procedure Name: Intubation ?Date/Time: 08/01/2021 1:56 PM ?Performed by: Caren Macadam, CRNA ?Pre-anesthesia Checklist: Patient identified, Emergency Drugs available, Suction available and Patient being monitored ?Patient Re-evaluated:Patient Re-evaluated prior to induction ?Oxygen Delivery Method: Circle system utilized ?Preoxygenation: Pre-oxygenation with 100% oxygen ?Induction Type: IV induction ?Ventilation: Mask ventilation without difficulty ?Laryngoscope Size: Hyacinth Meeker and 2 ?Grade View: Grade I ?Tube type: Oral ?Tube size: 7.5 mm ?Number of attempts: 1 ?Airway Equipment and Method: Stylet and Oral airway ?Placement Confirmation: ETT inserted through vocal cords under direct vision, positive ETCO2 and breath sounds checked- equal and bilateral ?Secured at: 24 cm ?Tube secured with: Tape ?Dental Injury: Teeth and Oropharynx as per pre-operative assessment  ? ? ? ? ?

## 2021-08-01 NOTE — Op Note (Signed)
Patient:  Edward Young ? ?DOB:  1950/07/19 ? ?MRN:  [id number] ? ? ?Preop Diagnosis:  Biliary colic, cholelithiasis ? ?Postop Diagnosis:  Same ? ?Procedure: Laparoscopic cholecystectomy ? ?Surgeon: Aviva Signs, MD ? ?Anes: General endotracheal ? ?Indications: Patient is a 71 year old white male who presents with biliary colic secondary to cholelithiasis.  The risks and benefits of the procedure including bleeding, infection, hepatobiliary injury, the possibility of an open procedure were fully explained to the patient, who gave informed consent. ? ?Procedure note: The patient was placed in supine position.  After induction of general endotracheal anesthesia, the abdomen was prepped and draped using the usual sterile technique with ChloraPrep.  Surgical site confirmation was performed. ? ?A supraumbilical incision was made down to the fascia.  A Veress needle was introduced into the abdominal cavity and confirmation of placement was done using the saline drop test.  The abdomen was then insufflated to 15 mmHg pressure.  An 11 mm trocar was introduced into the abdominal cavity under direct visualization without difficulty.  The patient was placed in reverse Trendelenburg position and an additional 11 mm trocar was placed in the epigastric region and 5 mm trocars were placed the right upper quadrant and right flank regions.  Liver was inspected and noted to be within normal limits.  The gallbladder was noted to be slightly inflamed but it was chock full of gallstones.  The gallbladder was retracted in a dynamic fashion in order to provide a critical view of the triangle of [place].  The cystic duct was first identified.  Its juncture to the infundibulum was fully identified.  Endoclips were placed proximally and distally on the cystic duct, and the cystic duct was divided.  This was likewise done the cystic artery.  The gallbladder was freed away from the gallbladder fossa using Bovie electrocautery.  The gallbladder was  delivered through the epigastric trocar site using an Endo Catch bag.  The gallbladder fossa was inspected and no abnormal bleeding or bile leakage was noted.  Surgicel powder and Surgicel snow were placed in the gallbladder fossa.  All fluid and air were then evacuated from the abdominal cavity prior to removal of the trocars. ? ?All wounds were irrigated with normal saline.  All wounds were injected with Exparel.  The supraumbilical fascia as well as epigastric fascia were reapproximated using 0 Vicryl interrupted sutures.  All skin incisions were closed using a 4-0 Monocryl subcuticular suture.  Dermabond was applied. ? ?All tape and needle counts were correct at the end of the procedure.  The patient was extubated in the operating room and transferred to PACU in stable condition. ? ?Complications: None ? ?EBL: Minimal ? ?Specimen: Gallbladder ? ? ?   ?

## 2021-08-01 NOTE — Progress Notes (Addendum)
?Progress Note ? ? ?Patient: Edward Young [id number] DOB: 02/19/1951 DOA: 07/29/2021     3 ?DOS: the patient was seen and examined on 08/01/2021 ?  ?Brief hospital course: ?As per H&P written by Dr. Denton Brick for 07/29/21 ?Dorrien Obie is a 71 y.o. male with medical history significant for Atria fib on chronic anticoagulation with warfarin, tobacco abuse, stroke. ?Patient presented to the ED with complaints of need chest pain ongoing for the past 2 weeks to months.  Reports onset also of upper abdominal pain mid abdomen and left side.  Reports poor oral intake, and weakness.  Reports subjective fevers and chills.  No vomiting no loose stools. ?He describes chest pain "light" and "squeezing", and nonradiating.  Chest pains are not related to activity.  He reports he has had atrial fibrillation for about 20 years and has never had chest pains.  He denies palpitations.  No difficulty breathing.  He reports dizziness 2 days with resultant fall, but did not hit his head. ?  ?ED Course: Temperature 97.5.  Heart rate ranging from 39-158.  Respiratory rate 17-24.  Blood pressure systolic 0000000.  O2 sats 94 to 100% on room air. ?Abdominal CT shows cholelithiasis with multiple intraluminal stones.  No findings to suggest cholecystitis. ?Total bilirubin 2.2.  Lipase normal at 38. ?General surgeon Dr. Constance Haw was consulted, treating as cholecystitis for now, obtain ultrasound in the morning trend liver enzymes, hold Coumadin, check INR, n.p.o. midnight. ?10 mg Cardizem bolus given, with Cardizem drip, 1 L bolus fluids given, ceftriaxone started. ? ?Assessment and Plan: ?Atrial fibrillation with rapid ventricular response -- ?-History of atrial fibrillation on anticoagulation, also on metoprolol.  ?-Patient reported compliance with his medications.  Last warfarin dose was night prior to admission.   ?-Status post prior ablation but A-fib remains recurrent ?-Previous attempt for antiarrhythmic/rhythm control resulted in relative  bradycardia ?-Cardiology consult/preop clearance appreciated ?-Metoprolol and Cardizem adjusted to try to improve rate control ?-Continue to hold Coumadin to allow for lap chole ?- echo in July 2018 with preserved ejection fraction and no significant valvular disorder.   ?-Continue p.o. Toprol-XL and Cardizem CD for rate control ? ?Esophageal reflux ?-Continue PPI. ? ?Tobacco abuse ?- Cessation counseling provided ?-Nicotine patch ordered. ? ?Cholelithiasis possible calculus cholecystitis ?-Status post lap chole on 08/01/2021 ?=-Resting comfortably postop ? ?Essential (primary) hypertension ?-Monitor BP closely with adjustments of Cardizem and metoprolol  ? ?Subjective:  ?-Male relative at bedside ?-Right upper quadrant pain not better, required IV opiates ?-No fevers no emesis ? ?Physical Exam: ?Vitals:  ? 08/01/21 1515 08/01/21 1530 08/01/21 1545 08/01/21 1648  ?BP: 128/86 127/87 (!) 128/94 133/89  ?Pulse: 75 96 100 86  ?Resp: (!) 22 18 (!) 27 18  ?Temp:    97.9 ?F (36.6 ?C)  ?TempSrc:    Oral  ?SpO2: 92% (!) 86% 91% 93%  ?Weight:      ?Height:      ? ? ?Physical Exam ? ?Gen:- Awake Alert, in no acute distress  ?HEENT:- [place].AT, No sclera icterus ?Neck-Supple Neck,No JVD,.  ?Lungs-  CTAB , fair air movement bilaterally  ?CV- S1, S2 normal, RRR ?Abd-  +ve B.Sounds, Abd Soft, appropriate postop tenderness ?extremity/Skin:- No  edema,   good pedal pulses  ?Psych-affect is appropriate, oriented x3 ?Neuro-generalized weakness, no new focal deficits, no tremors ? ? ?Lab Results  ?Component Value Date  ? WBC 5.5 07/31/2021  ? HGB 13.9 07/31/2021  ? HCT 42.4 07/31/2021  ? MCV 98.6 07/31/2021  ? PLT 186  07/31/2021  ? ?Lab Results  ?Component Value Date  ? NA 137 07/31/2021  ? K 3.7 07/31/2021  ? CO2 21 (L) 07/31/2021  ? GLUCOSE 96 07/31/2021  ? BUN 9 07/31/2021  ? CREATININE 1.05 07/31/2021  ? CALCIUM 8.5 (L) 07/31/2021  ? GFRNONAA >60 07/31/2021  ? ?Lab Results  ?Component Value Date  ? ALT 11 07/31/2021  ? AST 23  07/31/2021  ? ALKPHOS 61 07/31/2021  ? BILITOT 2.0 (H) 07/31/2021  ? ? ?Family Communication: -Male friend at bedside ?Disposition: Home after lap chole ?Status is: Inpatient ?Remains inpatient appropriate because: Anticipated cholecystectomy on 08/01/21 ? ? Planned Discharge Destination: Home ? ? ?Author: ?Roxan Hockey, MD ?08/01/2021 4:53 PM ? ?For on call review [email or web address].  ?

## 2021-08-01 NOTE — Interval H&P Note (Signed)
History and Physical Interval Note: ? ?08/01/2021 ?1:07 PM ? ?Edward Young  has presented today for surgery, with the diagnosis of cholelithiasis, cholecystitis.  The various methods of treatment have been discussed with the patient and family. After consideration of risks, benefits and other options for treatment, the patient has consented to  Procedure(s): ?LAPAROSCOPIC CHOLECYSTECTOMY (N/A) as a surgical intervention.  The patient's history has been reviewed, patient examined, no change in status, stable for surgery.  I have reviewed the patient's chart and labs.  Questions were answered to the patient's satisfaction.   ? ? ?[name] ? ? ?

## 2021-08-01 NOTE — Transfer of Care (Signed)
Immediate Anesthesia Transfer of Care Note ? ?Patient: Edward Young ? ?Procedure(s) Performed: LAPAROSCOPIC CHOLECYSTECTOMY (Abdomen) ? ?Patient Location: PACU ? ?Anesthesia Type:General ? ?Level of Consciousness: drowsy ? ?Airway & Oxygen Therapy: Patient Spontanous Breathing and Patient connected to face mask oxygen ? ?Post-op Assessment: Report given to RN and Post -op Vital signs reviewed and stable ? ?Post vital signs: Reviewed and stable ? ?Last Vitals:  ?Vitals Value Taken Time  ?BP    ?Temp    ?Pulse    ?Resp    ?SpO2    ? ? ?Last Pain:  ?Vitals:  ? 08/01/21 1318  ?TempSrc: Oral  ?PainSc: 0-No pain  ?   ? ?Patients Stated Pain Goal: 7 (08/01/21 1318) ? ?Complications: No notable events documented. ?

## 2021-08-01 NOTE — Anesthesia Postprocedure Evaluation (Signed)
Anesthesia Post Note ? ?Patient: Edward Young ? ?Procedure(s) Performed: LAPAROSCOPIC CHOLECYSTECTOMY (Abdomen) ? ?Patient location during evaluation: PACU ?Anesthesia Type: General ?Level of consciousness: awake and alert and oriented ?Pain management: pain level controlled ?Vital Signs Assessment: post-procedure vital signs reviewed and stable ?Respiratory status: spontaneous breathing, nonlabored ventilation and respiratory function stable ?Cardiovascular status: blood pressure returned to baseline and stable ?Postop Assessment: no apparent nausea or vomiting ?Anesthetic complications: no ? ? ?No notable events documented. ? ? ?Last Vitals:  ?Vitals:  ? 08/01/21 1318 08/01/21 1448  ?BP: 112/89 (!) 128/93  ?Pulse:    ?Resp: (!) 24 20  ?Temp: 36.5 ?C (!) 36.4 ?C  ?SpO2: 91% 90%  ?  ?Last Pain:  ?Vitals:  ? 08/01/21 1448  ?TempSrc:   ?PainSc: Asleep  ? ? ?  ?  ?  ?  ?  ?  ? ?Sharin Altidor C Jayin Derousse ? ? ? ? ?

## 2021-08-02 DIAGNOSIS — I4891 Unspecified atrial fibrillation: Secondary | ICD-10-CM | POA: Diagnosis not present

## 2021-08-02 LAB — COMPREHENSIVE METABOLIC PANEL
ALT: 13 U/L (ref 0–44)
AST: 29 U/L (ref 15–41)
Albumin: 2.9 g/dL — ABNORMAL LOW (ref 3.5–5.0)
Alkaline Phosphatase: 56 U/L (ref 38–126)
Anion gap: 13 (ref 5–15)
BUN: 14 mg/dL (ref 8–23)
CO2: 19 mmol/L — ABNORMAL LOW (ref 22–32)
Calcium: 8.5 mg/dL — ABNORMAL LOW (ref 8.9–10.3)
Chloride: 103 mmol/L (ref 98–111)
Creatinine, Ser: 1.28 mg/dL — ABNORMAL HIGH (ref 0.61–1.24)
GFR, Estimated: 59 mL/min — ABNORMAL LOW (ref >60)
Glucose, Bld: 150 mg/dL — ABNORMAL HIGH (ref 70–99)
Potassium: 5 mmol/L (ref 3.5–5.1)
Sodium: 135 mmol/L (ref 135–145)
Total Bilirubin: 1.4 mg/dL — ABNORMAL HIGH (ref 0.3–1.2)
Total Protein: 6.1 g/dL — ABNORMAL LOW (ref 6.5–8.1)

## 2021-08-02 LAB — CBC
HCT: 45.6 % (ref 39.0–52.0)
Hemoglobin: 14.7 g/dL (ref 13.0–17.0)
MCH: 32.2 pg (ref 26.0–34.0)
MCHC: 32.2 g/dL (ref 30.0–36.0)
MCV: 100 fL (ref 80.0–100.0)
Platelets: 179 10*3/uL (ref 150–400)
RBC: 4.56 MIL/uL (ref 4.22–5.81)
RDW: 16.1 % — ABNORMAL HIGH (ref 11.5–15.5)
WBC: 7.7 10*3/uL (ref 4.0–10.5)
nRBC: 0 % (ref 0.0–0.2)

## 2021-08-02 LAB — PHOSPHORUS: Phosphorus: 4 mg/dL (ref 2.5–4.6)

## 2021-08-02 LAB — PROTIME-INR
INR: 1.5 — ABNORMAL HIGH (ref 0.8–1.2)
Prothrombin Time: 18 seconds — ABNORMAL HIGH (ref 11.4–15.2)

## 2021-08-02 LAB — MAGNESIUM: Magnesium: 2 mg/dL (ref 1.7–2.4)

## 2021-08-02 MED ORDER — METOPROLOL TARTRATE 25 MG PO TABS
25.0000 mg | ORAL_TABLET | Freq: Two times a day (BID) | ORAL | 3 refills | Status: AC
Start: 2021-08-02 — End: ?

## 2021-08-02 MED ORDER — OXYCODONE HCL 5 MG PO TABS: 5.0000 mg | ORAL_TABLET | ORAL | 0 refills | Status: AC | PRN

## 2021-08-02 MED ORDER — PANTOPRAZOLE SODIUM 40 MG PO TBEC: 40.0000 mg | DELAYED_RELEASE_TABLET | Freq: Every day | ORAL | 3 refills | Status: AC

## 2021-08-02 MED ORDER — NICOTINE 14 MG/24HR TD PT24
14.0000 mg | MEDICATED_PATCH | Freq: Every day | TRANSDERMAL | 0 refills | Status: AC
Start: 2021-08-03 — End: ?

## 2021-08-02 MED ORDER — ONDANSETRON HCL 4 MG PO TABS: 4.0000 mg | ORAL_TABLET | Freq: Four times a day (QID) | ORAL | 0 refills | Status: AC | PRN

## 2021-08-02 MED ORDER — DILTIAZEM HCL ER COATED BEADS 120 MG PO CP24: 120.0000 mg | ORAL_CAPSULE | Freq: Every day | ORAL | 4 refills | Status: AC

## 2021-08-02 NOTE — Discharge Instructions (Addendum)
1)Avoid ibuprofen/Advil/Aleve/Motrin/Goody Powders/Naproxen/BC powders/Meloxicam/Diclofenac/Indomethacin and other Nonsteroidal anti-inflammatory medications as these will make you more likely to bleed and can cause stomach ulcers, can also cause Kidney problems.  ? ?2)Repeat CBC , PT/INR and CMP Blood test in 1 week  ? ?3)follow up with Dr Henreitta Leber--- general surgeon as advised ? ?Discharge Laparoscopic Surgery Instructions: ? ?Common Complaints: ?Right shoulder pain is common after laparoscopic surgery. This is secondary to the gas used in the surgery being trapped under the diaphragm.  ?Walk to help your body absorb the gas. This will improve in a few days. ?Pain at the port sites are common, especially the larger port sites. This will improve with time.  ?Some nausea is common and poor appetite. The main goal is to stay hydrated the first few days after surgery.  ? ?Diet/ Activity: ?Diet as tolerated. You may not have an appetite, but it is important to stay hydrated. Drink 64 ounces of water a day. Your appetite will return with time.  ?Shower per your regular routine daily.  Do not take hot showers. Take warm showers that are less than 10 minutes. ?Rest and listen to your body, but do not remain in bed all day.  ?Walk everyday for at least 15-20 minutes. Deep cough and move around every 1-2 hours in the first few days after surgery.  ?Do not lift > 10 lbs, perform excessive bending, pushing, pulling, squatting for 1-2 weeks after surgery.  ?Do not pick at the dermabond glue on your incision sites.  This glue film will remain in place for 1-2 weeks and will start to peel off.  ?Do not place lotions or balms on your incision unless instructed to specifically by Dr. Henreitta Leber.  ? ?Pain Expectations and Narcotics: ?-After surgery you will have pain associated with your incisions and this is normal. The pain is muscular and nerve pain, and will get better with time. ?-You are encouraged and expected to take non  narcotic medications like tylenol and ibuprofen (when able) to treat pain as multiple modalities can aid with pain treatment. ?-Narcotics are only used when pain is severe or there is breakthrough pain. ?-You are not expected to have a pain score of 0 after surgery, as we cannot prevent pain. A pain score of 3-4 that allows you to be functional, move, walk, and tolerate some activity is the goal. The pain will continue to improve over the days after surgery and is dependent on your surgery. ?-Due to [place] law, we are only able to give a certain amount of pain medication to treat post operative pain, and we only give additional narcotics on a patient by patient basis.  ?-For most laparoscopic surgery, studies have shown that the majority of patients only need 10-15 narcotic pills, and for open surgeries most patients only need 15-20.   ?-Having appropriate expectations of pain and knowledge of pain management with non narcotics is important as we do not want anyone to become addicted to narcotic pain medication.  ?-Using ice packs in the first 48 hours and heating pads after 48 hours, wearing an abdominal binder (when recommended), and using over the counter medications are all ways to help with pain management.   ?-Simple acts like meditation and mindfulness practices after surgery can also help with pain control and research has proven the benefit of these practices. ? ?Medication: ?Take tylenol and ibuprofen as needed for pain control, alternating every 4-6 hours.  ?Example:  ?Tylenol 1000mg  @ 6am, 12noon, 6pm, (Do not  exceed 4000mg  of tylenol a day). Ibuprofen 800mg  @ 9am, 3pm, 9pm, 3am (Do not exceed 3600mg  of ibuprofen a day).  ?Take Roxicodone for breakthrough pain every 4 hours.  ?Take Colace for constipation related to narcotic pain medication. If you do not have a bowel movement in 2 days, take Miralax over the counter.  ?Drink plenty of water to also prevent constipation.  ? ?Contact  Information: ?If you have questions or concerns, please call our office, [phone], Monday- Thursday 8AM-5PM and Friday 8AM-12Noon.  ?If it is after hours or on the weekend, please call [facility], [phone], [phone], and ask to speak to the surgeon on call for Dr. Thursday. [phone] at [facility].   ?

## 2021-08-02 NOTE — Progress Notes (Signed)
Abd surgical sites dry and intact with glue.  States passing gas and abd tender to touch but soft.  Has not c/o pain or nausea.  IV removed and DC instructions reviewed with patient and daughter.  Scripts sent to pharmacy and to follow up with surgery and primary md.  Transported by [facility] to entrance and daughter to drive home ?

## 2021-08-02 NOTE — Discharge Summary (Signed)
?                                                                                ? ? ?Edward Young, is a 71 y.o. male  DOB 1950-08-19  MRN [id number]. ? ?Admission date:  07/29/2021  Admitting Physician  Ejiroghene Arlyce Dice, MD ? ?Discharge Date:  08/02/2021  ? ?Primary MD  Rosalee Kaufman, PA-C ? ?Recommendations for primary care physician for things to follow:  ? ?1)Avoid ibuprofen/Advil/Aleve/Motrin/Goody Powders/Naproxen/BC powders/Meloxicam/Diclofenac/Indomethacin and other Nonsteroidal anti-inflammatory medications as these will make you more likely to bleed and can cause stomach ulcers, can also cause Kidney problems.  ? ?2)Repeat CBC , PT/INR and CMP Blood test in 1 week  ? ?3)follow up with Dr Constance Haw--- general surgeon as advised ? ?Admission Diagnosis  Cholecystitis [K81.9] ?Atrial fibrillation with rapid ventricular response ([place]) [I48.91] ?Atrial fibrillation with RVR ([place]) [I48.91] ? ? ?Discharge Diagnosis  Cholecystitis [K81.9] ?Atrial fibrillation with rapid ventricular response ([place]) [I48.91] ?Atrial fibrillation with RVR ([place]) [I48.91]   ? ?Principal Problem: ?  Atrial fibrillation with rapid ventricular response ([place]) ?Active Problems: ?  Esophageal reflux ?  Essential (primary) hypertension ?  Cholelithiasis ?  Tobacco abuse ?  Cholecystitis ?    ? ?Past Medical History:  ?Diagnosis Date  ? Arthritis   ? GERD (gastroesophageal reflux disease)   ? PAF (paroxysmal atrial fibrillation) ([place])   ? Pneumonia 2016  ? Stroke [facility])   ? ? ?Past Surgical History:  ?Procedure Laterality Date  ? ATRIAL FIBRILLATION ABLATION N/A 11/20/2016  ? Procedure: Atrial Fibrillation Ablation;  Surgeon: Thompson Grayer, MD;  Location: [facility];  Service: Cardiovascular;  Laterality: N/A;  ? FRACTURE SURGERY    ? NASAL FRACTURE SURGERY  ~ 2003  ? "opened it up so I could breath; it had been broken ~ 3 times"  ? ? ? HPI  from the history and physical done on the day of admission:  ? ?  ?Chief Complaint: Abdominal  and chest pain ?  ?HPI: Edward Young is a 71 y.o. male with medical history significant for Atria fib on chronic anticoagulation with warfarin, tobacco abuse, stroke. ?Patient presented to the ED with complaints of need chest pain ongoing for the past 2 weeks to months.  Reports onset also of upper abdominal pain mid abdomen and left side.  Reports poor oral intake, and weakness.  Reports subjective fevers and chills.  No vomiting no loose stools. ?He describes chest pain "light" and "squeezing", and nonradiating.  Chest pains are not related to activity.  He reports he has had atrial fibrillation for about 20 years and has never had chest pains.  He denies palpitations.  No difficulty breathing.  He reports dizziness 2 days with resultant fall, but did not hit his head. ?  ?ED Course: Temperature 97.5.  Heart rate ranging from 39-158.  Respiratory rate 17-24.  Blood pressure systolic 0000000.  O2 sats 94 to 100% on room air. ?Abdominal CT shows cholelithiasis with multiple intraluminal stones.  No findings to suggest cholecystitis. ?Total bilirubin 2.2.  Lipase normal at 38. ?General surgeon Dr. Constance Haw was consulted, treating as cholecystitis for now, obtain ultrasound in the  morning trend liver enzymes, hold Coumadin, check INR, n.p.o. midnight. ?10 mg Cardizem bolus given, with Cardizem drip, 1 L bolus fluids given, ceftriaxone started. ?  ?Review of Systems: As per HPI all other systems reviewed and negative. ? ? ? Hospital Course:  ? ?  ?As per H&P written by Dr. Mariea Clonts for 07/29/21 ?Edward Young is a 71 y.o. male with medical history significant for Atria fib on chronic anticoagulation with warfarin, tobacco abuse, stroke. ?Patient presented to the ED with complaints of need chest pain ongoing for the past 2 weeks to months.  Reports onset also of upper abdominal pain mid abdomen and left side.  Reports poor oral intake, and weakness.  Reports subjective fevers and chills.  No vomiting no loose stools. ?He  describes chest pain "light" and "squeezing", and nonradiating.  Chest pains are not related to activity.  He reports he has had atrial fibrillation for about 20 years and has never had chest pains.  He denies palpitations.  No difficulty breathing.  He reports dizziness 2 days with resultant fall, but did not hit his head. ?  ?ED Course: Temperature 97.5.  Heart rate ranging from 39-158.  Respiratory rate 17-24.  Blood pressure systolic 110-133.  O2 sats 94 to 100% on room air. ?Abdominal CT shows cholelithiasis with multiple intraluminal stones.  No findings to suggest cholecystitis. ?Total bilirubin 2.2.  Lipase normal at 38. ?General surgeon Dr. Henreitta Leber was consulted, treating as cholecystitis for now, obtain ultrasound in the morning trend liver enzymes, hold Coumadin, check INR, n.p.o. midnight. ?10 mg Cardizem bolus given, with Cardizem drip, 1 L bolus fluids given, ceftriaxone started. ? ? ?Assessment and Plan: ?Atrial fibrillation with rapid ventricular response -- ?-History of atrial fibrillation on anticoagulation, also on metoprolol.  ?-Patient reported compliance with his medications.  ?- Last warfarin dose was night prior to admission.   ?-Status post prior ablation but A-fib remains recurrent ?-Previous attempt for antiarrhythmic/rhythm control resulted in relative bradycardia ?-Cardiology consult/preop clearance appreciated ?- echo in July 2018 with preserved ejection fraction and no significant valvular disorder.   ?-Okay to discharge on Cardizem and metoprolol ?-Resume Coumadin ?-Follow-up with PCP for repeat PT/INR within a week ?  ?Esophageal reflux/GERD ?-Continue PPI. ?  ?Tobacco abuse ?- Cessation counseling provided ?-Nicotine patch ordered. ?  ?Cholelithiasis possible calculus cholecystitis ?-Status post lap chole on 08/01/2021 ?-Follow-up with general surgeon as advised ?-Eating and drinking well postoperatively ?  ?Essential (primary) hypertension ?Stable, continue Cardizem and metoprolol  ?   ?Discharge Condition: Stable ? ?Follow UP ? ? Follow-up Information   ? ? [name], MD Follow up in 2 week(s).   ?Specialty: General Surgery ?Why: post operative phone call; if you need to be seen in person call the office ?Contact information: ?[address] ?[place] [address] ?[phone] ? ? ?  ?  ? ?  ?  ? ?  ?  ? ?Consults obtained -General surgery/cardiology ? ?Diet and Activity recommendation:  As advised ? ?Discharge Instructions   ? ?* ?Discharge Instructions   ? ? Call MD for:  difficulty breathing, headache or visual disturbances   Complete by: As directed ?  ? Call MD for:  persistant dizziness or light-headedness   Complete by: As directed ?  ? Call MD for:  persistant nausea and vomiting   Complete by: As directed ?  ? Call MD for:  temperature >100.4   Complete by: As directed ?  ? Diet - low sodium heart healthy  Complete by: As directed ?  ? Discharge instructions   Complete by: As directed ?  ? 1)Avoid ibuprofen/Advil/Aleve/Motrin/Goody Powders/Naproxen/BC powders/Meloxicam/Diclofenac/Indomethacin and other Nonsteroidal anti-inflammatory medications as these will make you more likely to bleed and can cause stomach ulcers, can also cause Kidney problems.  ? ?2)Repeat CBC , PT/INR and CMP Blood test in 1 week  ? ?3)follow up with Dr Constance Haw--- general surgeon as advised  ? Discharge wound care:   Complete by: As directed ?  ? Keep postoperative wound clean and dry  ? Increase activity slowly   Complete by: As directed ?  ? ?  ? ? ? ? Discharge Medications  ? ?  ?Allergies as of 08/02/2021   ? ?   Reactions  ? Banana Anaphylaxis  ? Hibiclens [chlorhexidine Gluconate] Other (See Comments)  ? Pt c/o burning and irritation when wiped with chlorhexidine wipes  ? ?  ? ?  ?Medication List  ?  ? ?TAKE these medications   ? ?acetaminophen 500 MG tablet ?Commonly known as: TYLENOL ?Take 500 mg by mouth every 6 (six) hours as needed for moderate pain. ?  ?calcium carbonate 500 MG chewable  tablet ?Commonly known as: TUMS - dosed in mg elemental calcium ?Chew 1 tablet by mouth as needed for indigestion or heartburn. ?  ?diltiazem 120 MG 24 hr capsule ?Commonly known as: CARDIZEM CD ?Take 1 ca

## 2021-08-02 NOTE — Progress Notes (Signed)
[facility] Progress Note ? ?1 Day Post-Op  ?Subjective: ?Eating, doing good, sore.  ? ?Objective: ?Vital signs in last 24 hours: ?Temp:  [97.5 ?F (36.4 ?C)-98.4 ?F (36.9 ?C)] 97.6 ?F (36.4 ?C) (04/07 1226) ?Pulse Rate:  [63-100] 79 (04/07 1226) ?Resp:  [16-27] 20 (04/07 1226) ?BP: (121-139)/(79-94) 133/83 (04/07 1226) ?SpO2:  [86 %-96 %] 91 % (04/07 1226) ?Last BM Date : 07/30/21 ? ?Intake/Output from previous day: ?04/06 0701 - 04/07 0700 ?In: 1577 [P.O.:480; I.V.:500; IV Piggyback:597] ?Out: 600 [Urine:600] ?Intake/Output this shift: ?Total I/O ?In: 480 [P.O.:480] ?Out: 100 [Urine:100] ? ?General appearance: alert and no distress ?GI: soft, nondistended, appropriately tender, incisions c/d/I with dermabond  ? ?Lab Results:  ?Recent Labs  ?  07/31/21 ?0448 08/02/21 ?0438  ?WBC 5.5 7.7  ?HGB 13.9 14.7  ?HCT 42.4 45.6  ?PLT 186 179  ? ?BMET ?Recent Labs  ?  07/31/21 ?0448 08/02/21 ?0438  ?NA 137 135  ?K 3.7 5.0  ?CL 107 103  ?CO2 21* 19*  ?GLUCOSE 96 150*  ?BUN 9 14  ?CREATININE 1.05 1.28*  ?CALCIUM 8.5* 8.5*  ? ?PT/INR ?Recent Labs  ?  08/01/21 ?1041 08/02/21 ?0438  ?LABPROT 20.1* 18.0*  ?INR 1.7* 1.5*  ? ? ?Studies/Results: ?No results found. ? ?Anti-infectives: ?Anti-infectives (From admission, onward)  ? ? Start     Dose/Rate Route Frequency Ordered Stop  ? 08/01/21 0600  cefoTEtan (CEFOTAN) 2 g in sodium chloride 0.9 % 100 mL IVPB       ? 2 g ?200 mL/hr over 30 Minutes Intravenous On call to O.R. 07/31/21 1127 08/01/21 1426  ? 07/31/21 0600  cefoTEtan (CEFOTAN) 2 g in sodium chloride 0.9 % 100 mL IVPB  Status:  Discontinued       ? 2 g ?200 mL/hr over 30 Minutes Intravenous On call to O.R. 07/30/21 2125 07/31/21 1126  ? 07/30/21 1800  cefTRIAXone (ROCEPHIN) 2 g in sodium chloride 0.9 % 100 mL IVPB       ? 2 g ?200 mL/hr over 30 Minutes Intravenous Every 24 hours 07/30/21 0000    ? 07/30/21 0015  cefTRIAXone (ROCEPHIN) 1 g in sodium chloride 0.9 % 100 mL IVPB       ? 1 g ?200 mL/hr over 30  Minutes Intravenous  Once 07/30/21 0000 07/30/21 0112  ? 07/30/21 0000  metroNIDAZOLE (FLAGYL) IVPB 500 mg  Status:  Discontinued       ? 500 mg ?100 mL/hr over 60 Minutes Intravenous Every 12 hours 07/30/21 0000 08/01/21 1801  ? 07/29/21 1815  cefTRIAXone (ROCEPHIN) 1 g in sodium chloride 0.9 % 100 mL IVPB       ? 1 g ?200 mL/hr over 30 Minutes Intravenous  Once 07/29/21 1807 07/29/21 1926  ? ?  ? ? ?Assessment/Plan: ?Patient s/p laparoscopic cholecystectomy for cholecystitis. Doing well.  ?PRN for pain ?Dc home ?Diet as tolerated ?2 week post op phone call with Dr. Lovell Sheehan  ? ?Discussed with Dr. Mariea Clonts.  ? ? LOS: 4 days  ? ? ?Lucretia Roers ?08/02/2021 ? ?

## 2021-08-06 ENCOUNTER — Encounter (HOSPITAL_COMMUNITY): Payer: Self-pay | Admitting: General Surgery

## 2021-08-06 LAB — SURGICAL PATHOLOGY

## 2021-08-16 ENCOUNTER — Encounter: Payer: Self-pay | Admitting: General Surgery

## 2021-08-16 ENCOUNTER — Ambulatory Visit (INDEPENDENT_AMBULATORY_CARE_PROVIDER_SITE_OTHER): Payer: Medicare Other | Admitting: General Surgery

## 2021-08-16 DIAGNOSIS — Z09 Encounter for follow-up examination after completed treatment for conditions other than malignant neoplasm: Secondary | ICD-10-CM

## 2021-08-16 NOTE — Progress Notes (Signed)
Virtual postoperative telephone visit performed with patient.  He states he is doing very well.  He denies any nausea or vomiting.  He has no incisional pain.  He is pleased with the results.  I told him to call me should any problems arise. ? ?As this was a part of the global surgical fee, this was not a billable visit.  Total telephone time was 3 minutes. ?

## 2021-08-19 ENCOUNTER — Encounter: Payer: Medicare Other | Admitting: General Surgery

## 2022-04-23 IMAGING — CT CT ABD-PELV W/ CM
3 of 5 series · 16 of 46 positions shown, 18 images · IV contrast (Omnipaque or Isovue)
Comparison: CT 12/25/2020

CLINICAL DATA: Nausea/vomiting Abdominal pain, acute, nonlocalized

EXAM:
CT ABDOMEN AND PELVIS WITH CONTRAST
TECHNIQUE: Multidetector CT imaging of the abdomen and pelvis was performed
using the standard protocol following bolus administration of
intravenous contrast.

[Series 2: axial st · axial · 0.78mm/px · z∈[-771,-411]mm · 10 of 90 slices shown, 12 images]
[im 9/90  soft-tissue]
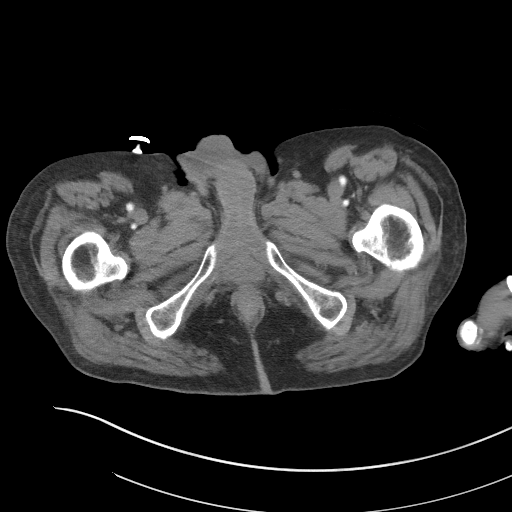
[im 9/90  bone]
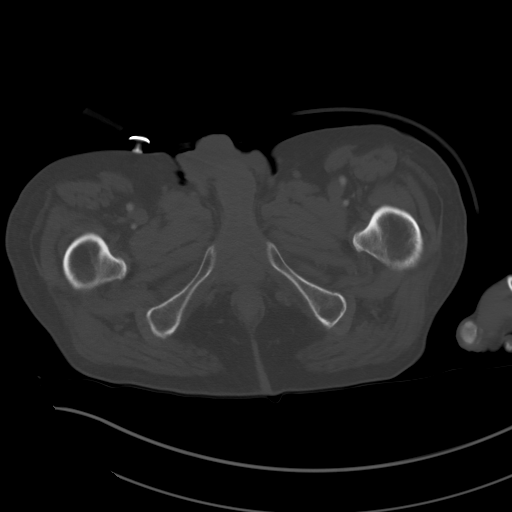
[im 17/90  soft-tissue]
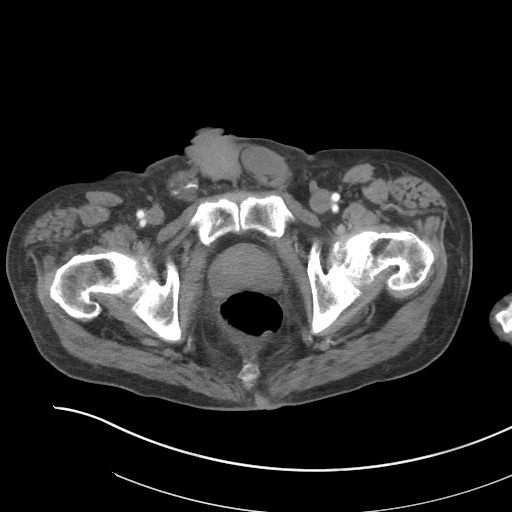
[im 25/90  soft-tissue]
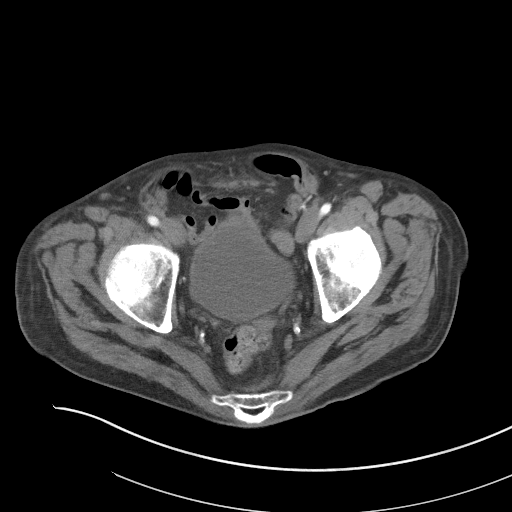
[im 33/90  soft-tissue]
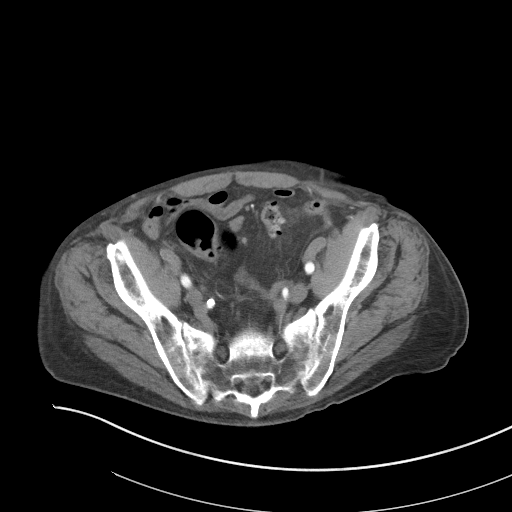
[im 41/90  soft-tissue]
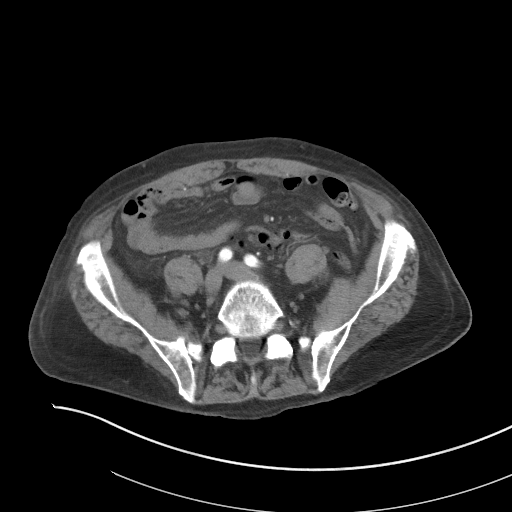
[im 49/90  soft-tissue]
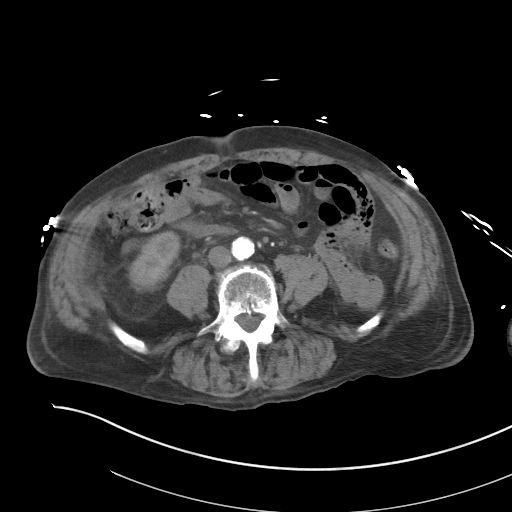
[im 57/90  soft-tissue]
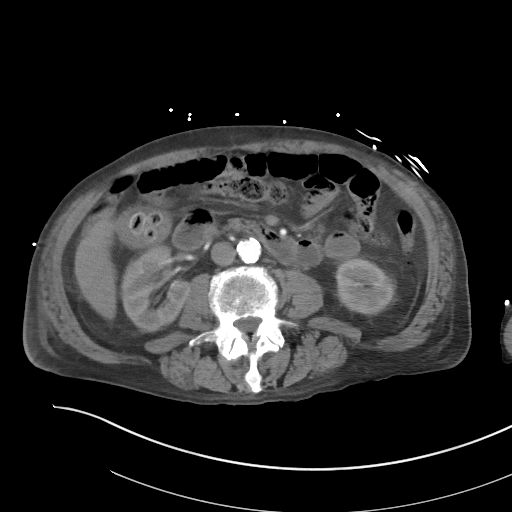
[im 65/90  soft-tissue]
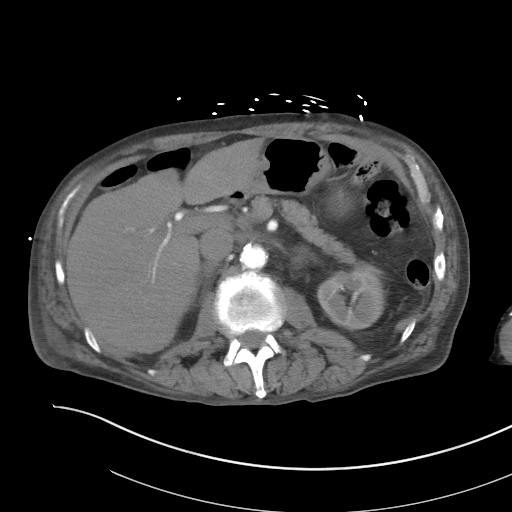
[im 73/90  soft-tissue]
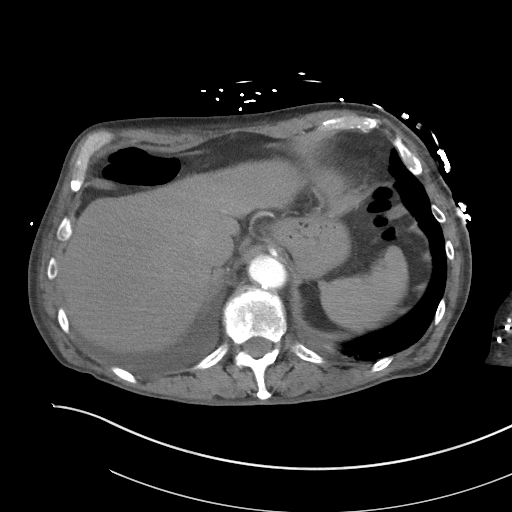
[im 73/90  bone]
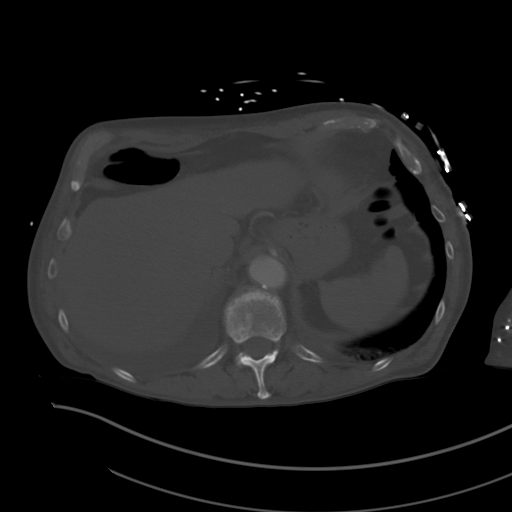
[im 81/90  soft-tissue]
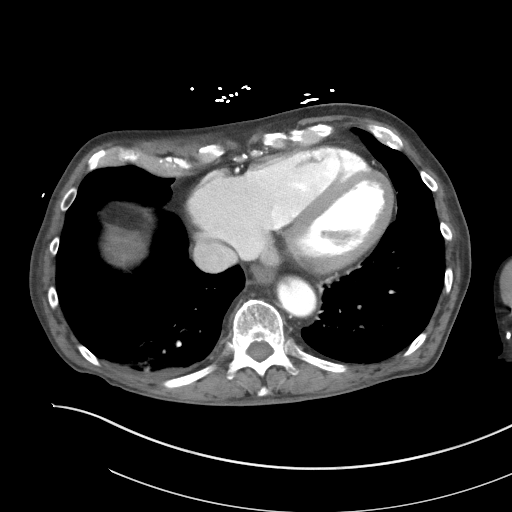

[Series 4: lung bases · axial · 0.78mm/px · z∈[-776,-666]mm · 3 of 90 slices shown]
[im 8/90  bone]
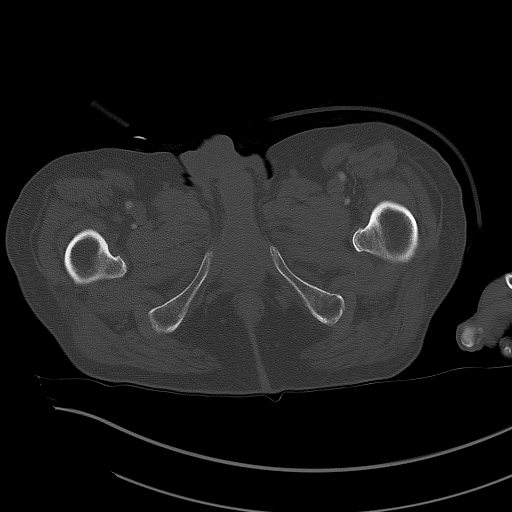
[im 23/90  bone]
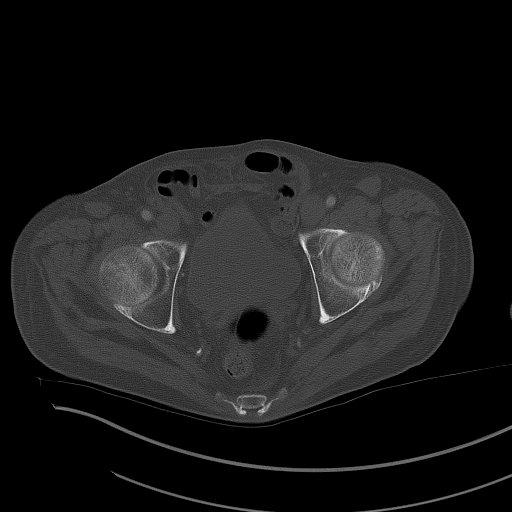
[im 30/90  bone]
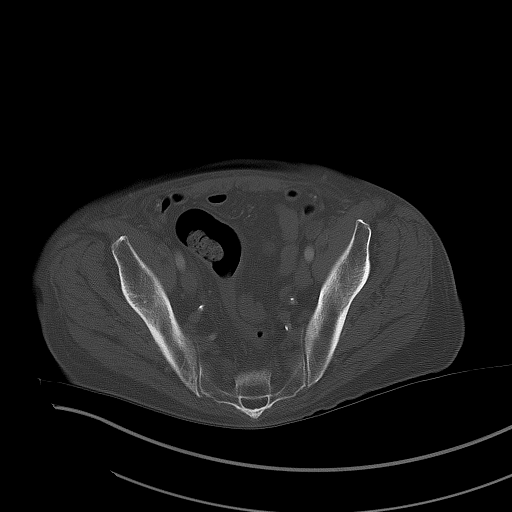

[Series 5: coronal st · coronal · 0.79mm/px · 3 of 105 slices shown]
[im 35/105  soft-tissue]
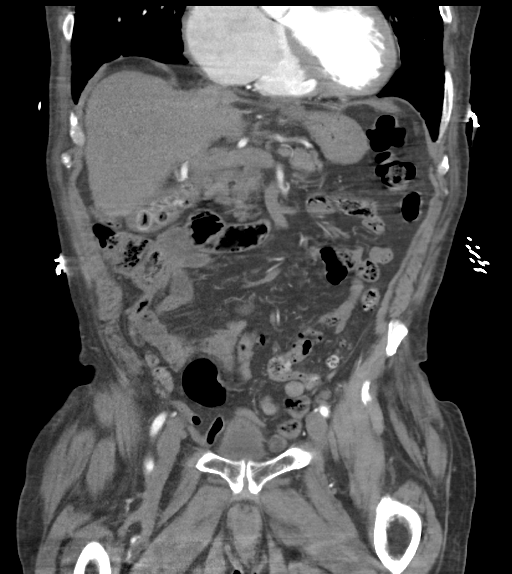
[im 47/105  soft-tissue]
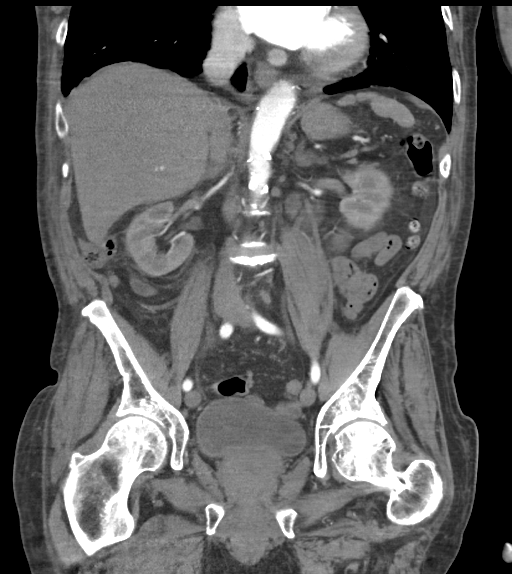
[im 58/105  soft-tissue]
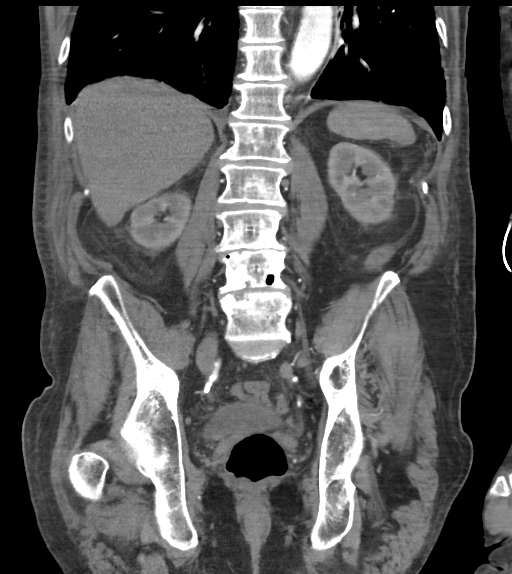

[16 of 46 positions shown; findings below may reference images not displayed]

RADIATION DOSE REDUCTION: This exam was performed according to the
departmental dose-optimization program which includes automated
exposure control, adjustment of the mA and/or kV according to
patient size and/or use of iterative reconstruction technique.

CONTRAST:  100mL OMNIPAQUE IOHEXOL 300 MG/ML  SOLN
FINDINGS: Lower chest: Small right pleural effusion with adjacent atelectasis.
Cardiomegaly with dilated right atrium and right ventricle.

Hepatobiliary: No focal liver abnormality is seen. Cholelithiasis
with multiple intraluminal stones. The gallbladder appears
nondilated and without adjacent inflammatory stranding.

Pancreas: Unremarkable. No pancreatic ductal dilatation or
surrounding inflammatory changes.

Spleen: Normal in size without focal abnormality.

Adrenals/Urinary Tract: Adrenal glands are unremarkable. No
hydronephrosis or nephrolithiasis. The bladder is moderately
distended but unremarkable.

Stomach/Bowel: The stomach is within normal limits. There is no
evidence of bowel obstruction.The appendix is normal. Sigmoid
diverticulosis. No acute diverticulitis.

Vascular/Lymphatic: Aortoiliac atherosclerotic calcifications. No
AAA. No lymphadenopathy.

Reproductive: Unremarkable.

Other: There are bilateral inguinal hernias containing nonobstructed
bowel. Mild generalized intra-abdominal and body wall edema.

Musculoskeletal: No acute osseous abnormality. No suspicious lytic
or blastic lesions. Multilevel degenerative changes of the spine.
There is bilateral hip osteoarthritis.
IMPRESSION: Cholelithiasis with multiple intraluminal stones. No other CT
findings to suggest cholecystitis.

Sigmoid diverticulosis without evidence of acute diverticulitis.

Bowel containing bilateral inguinal hernias. No evidence of bowel
obstruction.

Normal appendix.

Small right pleural effusion with adjacent atelectasis.
Cardiomegaly.

## 2022-04-24 IMAGING — US US ABDOMEN LIMITED
1 series · 14 of 25 positions shown · non-contrast
Comparison: CT of the abdomen on 07/29/2021

CLINICAL DATA: Cholelithiasis.

EXAM:
ULTRASOUND ABDOMEN LIMITED RIGHT UPPER QUADRANT

[Series 1: us abdomen limited ruq (liver/gb) · 14 of 38 slices shown]
[im 1/38]
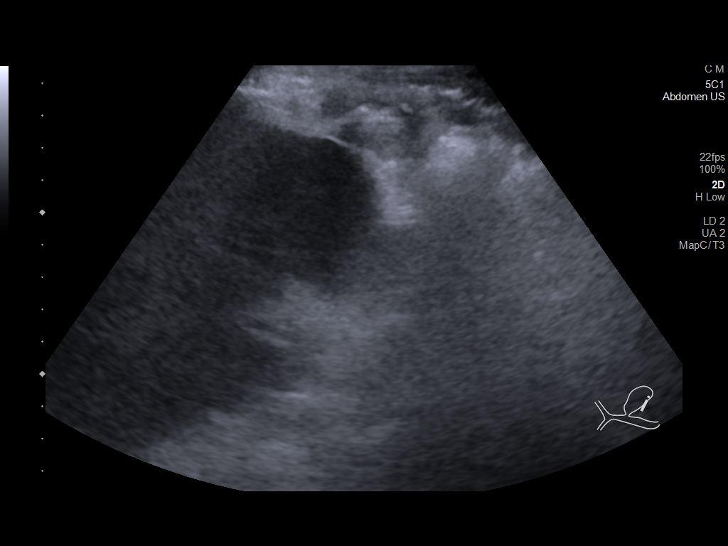
[im 4/38]
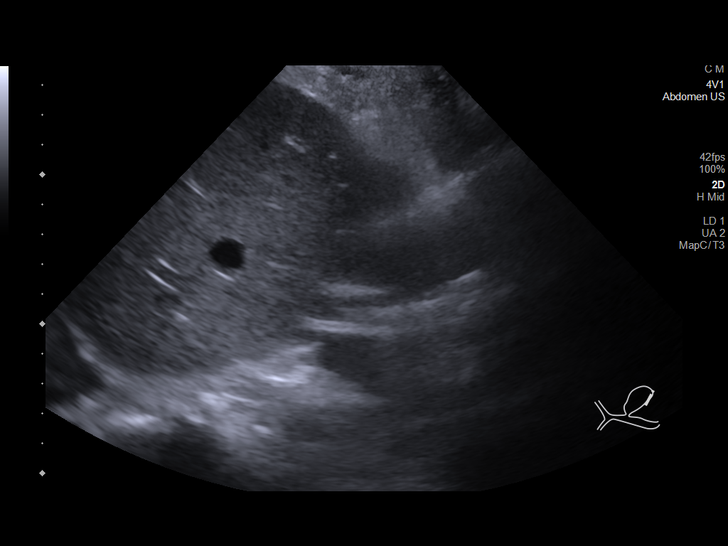
[im 7/38]
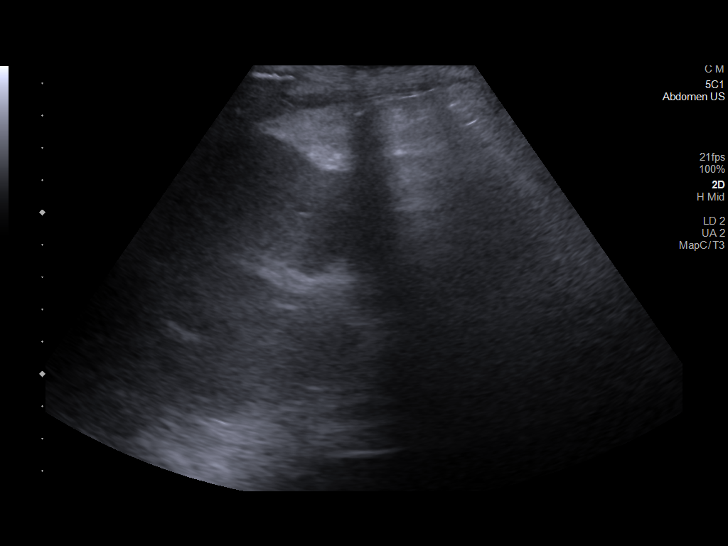
[im 10/38]
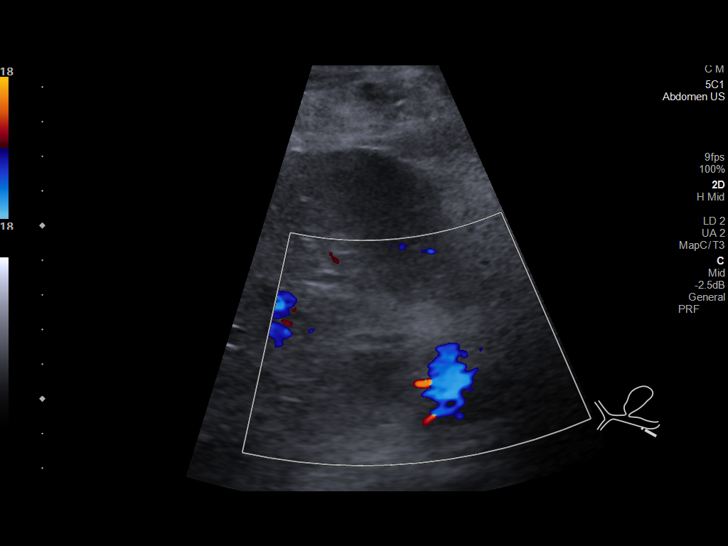
[im 13/38]
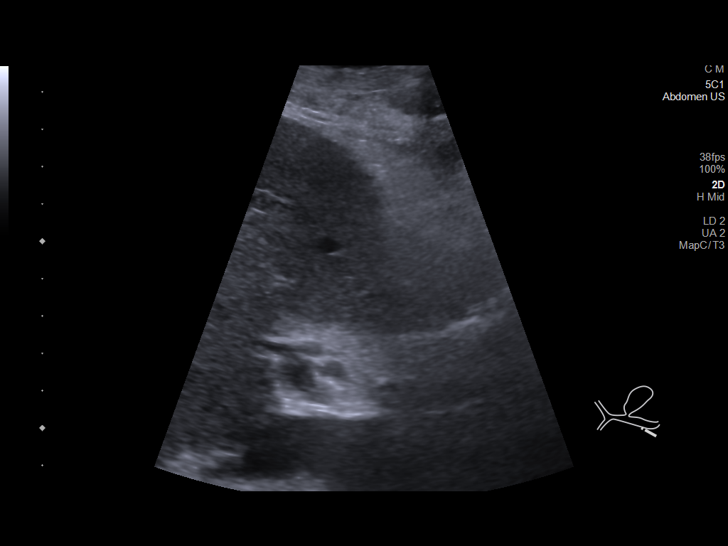
[im 14/38]
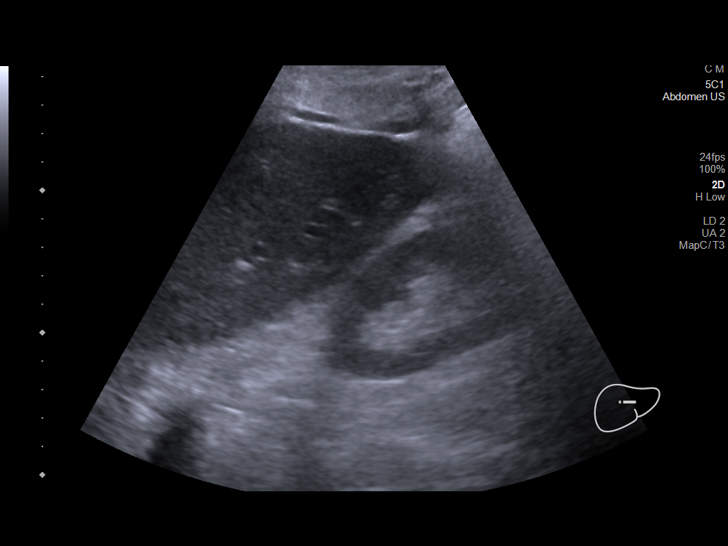
[im 17/38]
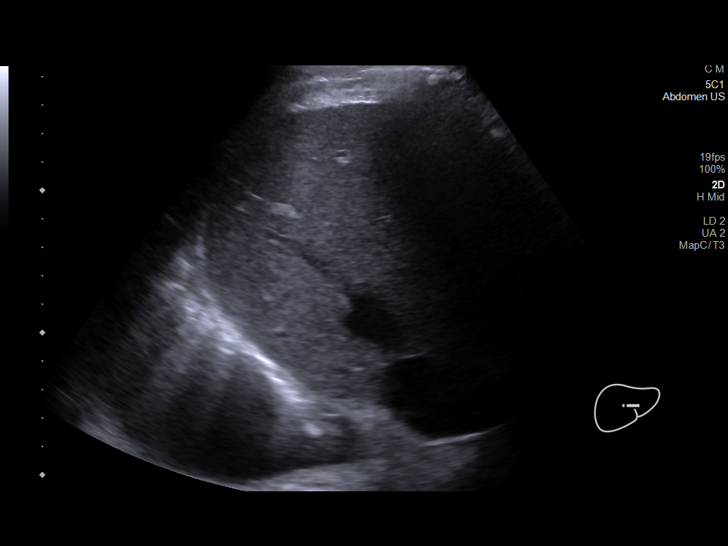
[im 21/38]
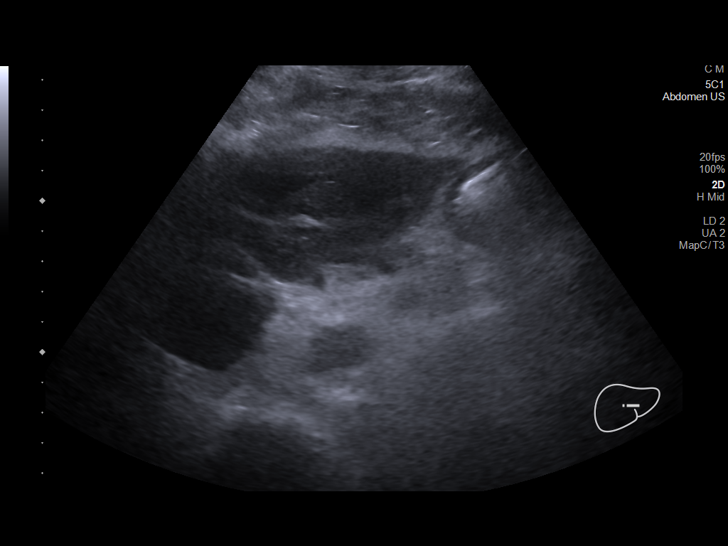
[im 24/38]
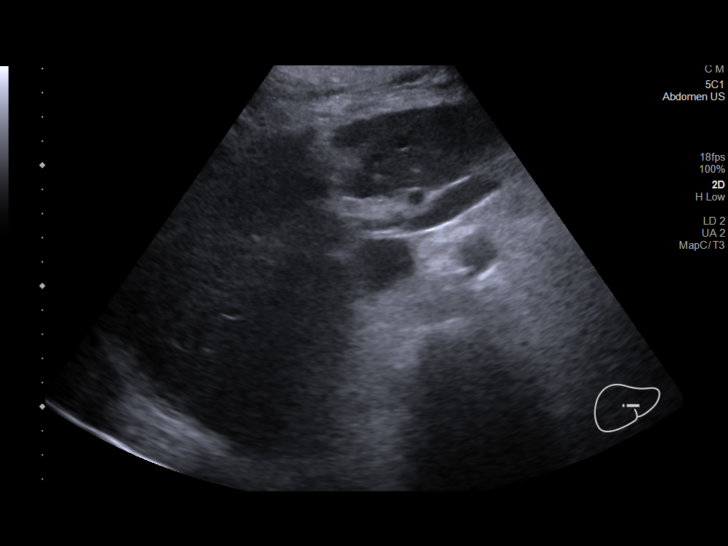
[im 25/38]
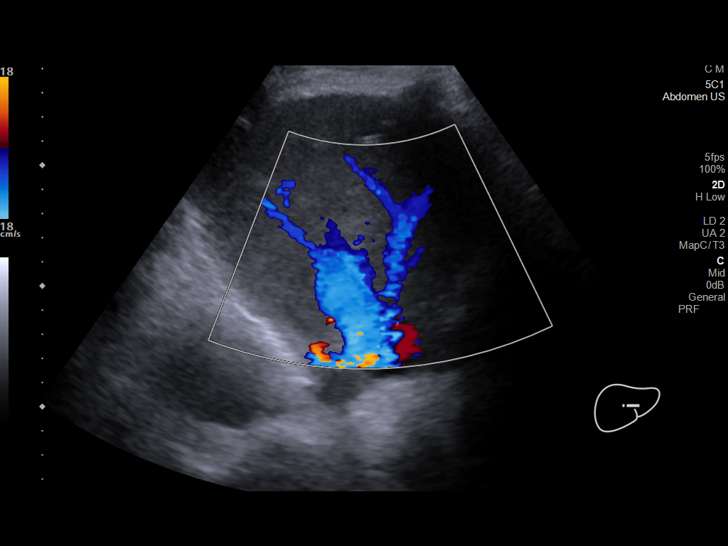
[im 28/38]
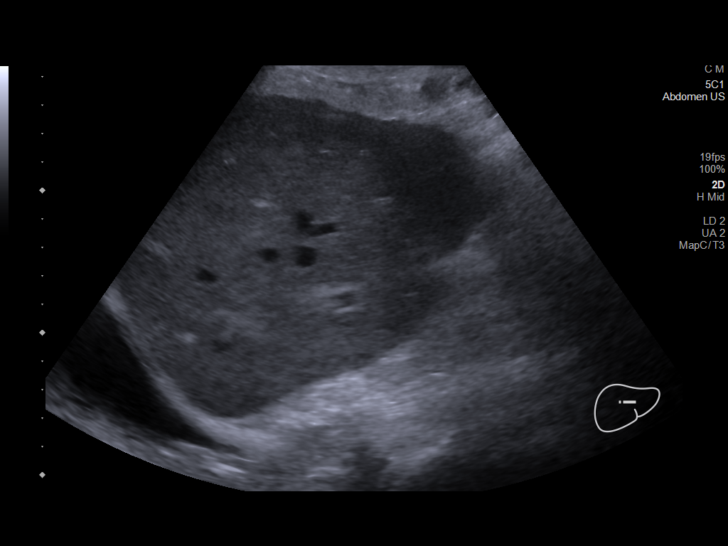
[im 31/38]
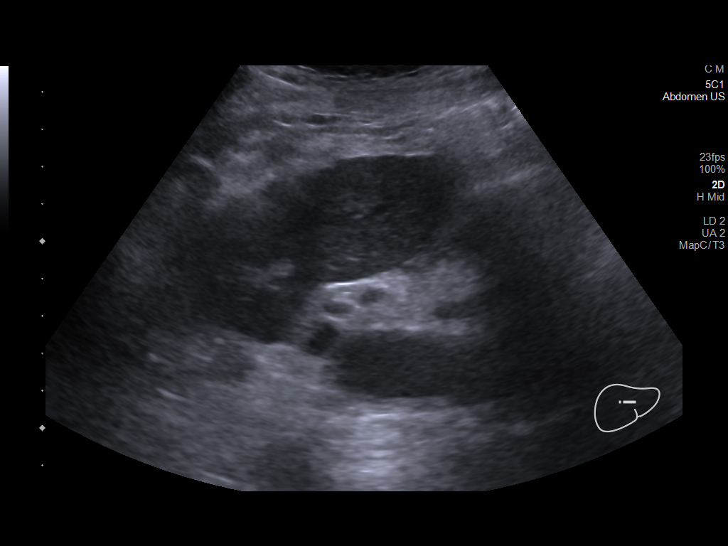
[im 34/38]
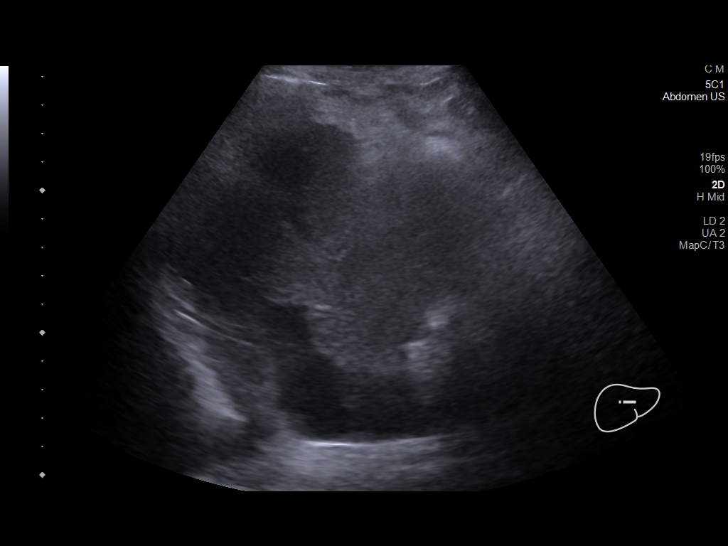
[im 38/38]
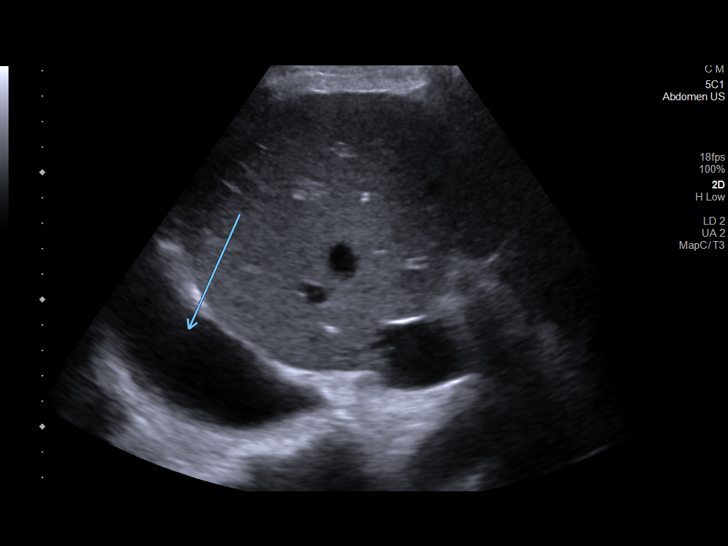

[14 of 25 positions shown; findings below may reference images not displayed]

FINDINGS: Gallbladder:

Despite change in patient positioning and utilizing multiple
windows, the gallbladder is not visualized by ultrasound. This can
be explained by CT appearance with colon and small bowel draped over
the region of the gallbladder and anterior liver.

Common bile duct:

Diameter: 3 mm

Liver:

No focal lesion identified. Within normal limits in parenchymal
echogenicity. Portal vein is patent on color Doppler imaging with
normal direction of blood flow towards the liver.

Other: Small right pleural effusion.
IMPRESSION: The gallbladder cannot be identified by ultrasound due to overlying
bowel gas. This is explained on the CT by multiple bowel loops
overlying the region of the gallbladder and anterior liver. No
evidence of biliary ductal dilatation.
